# Patient Record
Sex: Female | Born: 1970 | Race: White | Hispanic: No | Smoking: Never smoker
Health system: Southern US, Community
[De-identification: ages and names within clinical notes are randomized; demographics above are authoritative.]

## PROBLEM LIST (undated history)

## (undated) DIAGNOSIS — G8929 Other chronic pain: Secondary | ICD-10-CM

## (undated) DIAGNOSIS — N903 Dysplasia of vulva, unspecified: Secondary | ICD-10-CM

## (undated) DIAGNOSIS — Z9889 Other specified postprocedural states: Secondary | ICD-10-CM

## (undated) DIAGNOSIS — M542 Cervicalgia: Secondary | ICD-10-CM

## (undated) DIAGNOSIS — R42 Dizziness and giddiness: Secondary | ICD-10-CM

## (undated) DIAGNOSIS — G43009 Migraine without aura, not intractable, without status migrainosus: Secondary | ICD-10-CM

## (undated) DIAGNOSIS — R112 Nausea with vomiting, unspecified: Secondary | ICD-10-CM

## (undated) DIAGNOSIS — Z8742 Personal history of other diseases of the female genital tract: Secondary | ICD-10-CM

## (undated) DIAGNOSIS — R002 Palpitations: Secondary | ICD-10-CM

## (undated) DIAGNOSIS — F419 Anxiety disorder, unspecified: Secondary | ICD-10-CM

## (undated) DIAGNOSIS — E0500 Thyrotoxicosis with diffuse goiter without thyrotoxic crisis or storm: Secondary | ICD-10-CM

## (undated) DIAGNOSIS — R7989 Other specified abnormal findings of blood chemistry: Secondary | ICD-10-CM

## (undated) DIAGNOSIS — I671 Cerebral aneurysm, nonruptured: Secondary | ICD-10-CM

## (undated) DIAGNOSIS — G35 Multiple sclerosis: Secondary | ICD-10-CM

## (undated) DIAGNOSIS — R945 Abnormal results of liver function studies: Secondary | ICD-10-CM

## (undated) HISTORY — DX: Migraine without aura, not intractable, without status migrainosus: G43.009

## (undated) HISTORY — DX: Other specified abnormal findings of blood chemistry: R79.89

## (undated) HISTORY — DX: Multiple sclerosis: G35

## (undated) HISTORY — DX: Thyrotoxicosis with diffuse goiter without thyrotoxic crisis or storm: E05.00

## (undated) HISTORY — DX: Abnormal results of liver function studies: R94.5

## (undated) HISTORY — DX: Cervicalgia: M54.2

---
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
## 1997-09-21 ENCOUNTER — Ambulatory Visit (HOSPITAL_COMMUNITY): Admission: RE | Admit: 1997-09-21 | Discharge: 1997-09-21 | Payer: Self-pay | Admitting: Neurology

## 1998-02-24 ENCOUNTER — Ambulatory Visit (HOSPITAL_COMMUNITY): Admission: RE | Admit: 1998-02-24 | Discharge: 1998-02-24 | Payer: Self-pay | Admitting: Obstetrics and Gynecology

## 1998-08-21 ENCOUNTER — Other Ambulatory Visit: Admission: RE | Admit: 1998-08-21 | Discharge: 1998-08-21 | Payer: Self-pay | Admitting: Obstetrics and Gynecology

## 1999-02-21 ENCOUNTER — Other Ambulatory Visit: Admission: RE | Admit: 1999-02-21 | Discharge: 1999-02-21 | Payer: Self-pay | Admitting: Obstetrics and Gynecology

## 2000-02-12 ENCOUNTER — Encounter: Admission: RE | Admit: 2000-02-12 | Discharge: 2000-02-12 | Payer: Self-pay | Admitting: *Deleted

## 2000-02-12 ENCOUNTER — Encounter: Payer: Self-pay | Admitting: *Deleted

## 2000-02-21 ENCOUNTER — Other Ambulatory Visit: Admission: RE | Admit: 2000-02-21 | Discharge: 2000-02-21 | Payer: Self-pay | Admitting: Obstetrics and Gynecology

## 2000-06-22 ENCOUNTER — Emergency Department (HOSPITAL_COMMUNITY): Admission: EM | Admit: 2000-06-22 | Discharge: 2000-06-23 | Payer: Self-pay | Admitting: Emergency Medicine

## 2000-06-23 ENCOUNTER — Encounter: Payer: Self-pay | Admitting: Emergency Medicine

## 2002-04-07 ENCOUNTER — Other Ambulatory Visit: Admission: RE | Admit: 2002-04-07 | Discharge: 2002-04-07 | Payer: Self-pay | Admitting: Obstetrics and Gynecology

## 2003-04-19 ENCOUNTER — Other Ambulatory Visit: Admission: RE | Admit: 2003-04-19 | Discharge: 2003-04-19 | Payer: Self-pay | Admitting: Obstetrics and Gynecology

## 2005-09-17 ENCOUNTER — Ambulatory Visit: Payer: Self-pay | Admitting: Psychiatry

## 2006-04-22 HISTORY — PX: BREAST ENHANCEMENT SURGERY: SHX7

## 2007-04-23 HISTORY — PX: TUBAL LIGATION: SHX77

## 2007-06-08 ENCOUNTER — Ambulatory Visit (HOSPITAL_COMMUNITY): Admission: RE | Admit: 2007-06-08 | Discharge: 2007-06-08 | Payer: Self-pay | Admitting: Obstetrics and Gynecology

## 2007-06-08 HISTORY — PX: LAPAROSCOPY WITH TUBAL LIGATION: SHX5576

## 2010-09-04 NOTE — Op Note (Signed)
Kristin Stewart, Kristin Stewart                 ACCOUNT NO.:  [id number]   MEDICAL RECORD NO.:  [id number]          PATIENT TYPE:  AMB   LOCATION:  SDC                           FACILITY:  [facility]   PHYSICIAN:  Lenoard Aden, M.D.DATE OF BIRTH:  1971-02-13   DATE OF PROCEDURE:  06/08/2007  DATE OF DISCHARGE:                               OPERATIVE REPORT   PREOPERATIVE DIAGNOSIS:  Desire for elective sterilization.   POSTOPERATIVE DIAGNOSIS:  Desire for elective sterilization.   PROCEDURE:  Diagnostic hysteroscopy, attempted Essure with secondary  uterine perforation, diagnostic laparoscopy, laparoscopic tubal ligation  with repair of small midline fundal uterine perforation.   SURGEON:  Lenoard Aden, M.D.   ASSISTANT:  None.   ANESTHESIA:  General.   ESTIMATED BLOOD LOSS:  50 mL.   COMPLICATIONS:  None.   DRAINS:  None.   COUNTS:  Correct.   Patient to recovery in good condition.   BRIEF OP NOTE:  After being apprised of risks of anesthesia, infection,  bleeding, injury to abdominal organs, need for repair, possible uterine  perforation with need for repair, failure risk of tubal ligation 5 to 10  per 1000 the patient to the operating where she was administered a  general anesthetic without complications, prepped and draped in usual  sterile fashion, catheterized until the bladder was empty.  Exam under  anesthesia reveals a small anteflexed uterus and no adnexal masses.  Speculum placed, paracervical block placed in the standard fashion.  Anterior lip of the cervix grasped using a single-tooth tenaculum and  cervix easily dilated up to a #21 Pratt dilator.  Upon entry of the  hysteroscope, visualization is poor revealing no evidence of tubal ostia  and upon further manipulation, there is diagnosis of small uterine  perforation in the midline, deficit 185 mL noted.  Hysteroscopy was  thereby aborted and removed, minimal bleeding is noted.  Rubens cannula  was placed per  vagina into the cervix and attention was turned to  laparoscopic portion of procedure whereby infraumbilical incision was  made with the scalpel.  Veress needle placed.  Opening pressure -2 was  noted.  3 liters of CO2 was insufflated without difficulty.  Trocar is  placed atraumatically.  Pictures were taken.  Liver, gallbladder bed  appear normal, appendix is not visualized.  Upon visualization of the  pelvis there is a fair amount of clear fluid in the posterior cul-de-sac  and in addition, there was a midline uterine perforation which appears  to be bleeding slowly and minimally.  At this time there are normal  tubes, normal ovaries noted.  The right tube was traced out to the  fimbriated end.  A 5 mm suprapubic port was placed for irrigation  atraumatically and under direct visualization.  At this time irrigation  having been accomplished, the right tube was traced out the fimbriated  end and ampullary isthmic portion of the tube is identified and  cauterized using bipolar cautery in three contiguous segments down to  resistance of 0.  The same procedure that was done on the right tube was  done  on the left tube and both tubes were divided.  The tubal ostia were  visualized, normal tubes, normal ovaries were noted.  At this time  Garnette Czech is entered and irrigation is performed.  The uterine perforation  area is cauterized using Kleppinger bipolar cautery with good hemostasis  noted.  Irrigation accomplished and fluid is evacuated from the pelvis.  At this time CO2 was released and revisualization reveals no bleeding  from either tubal division site nor from the uterine perforation.  At  this time the Rubens cannula was also removed per vagina and there is no  evidence of bleeding from the uterine perforation.  Thereby all trocar  sites are removed under direct visualization, CO2 having been released.  Incisions were closed using 0-0 Vicryl and Dermabond.  Instruments  removed from  vagina.  The patient is awakened and transferred to  recovery in good condition.      Lenoard Aden, M.D.  Electronically Signed     RJT/MEDQ  D:  06/08/2007  T:  06/09/2007  Job:  [id number]

## 2010-09-07 NOTE — Discharge Summary (Signed)
[name], Kristin Stewart                 ACCOUNT NO.:  [id number]   MEDICAL RECORD NO.:  [id number]          PATIENT TYPE:  AMB   LOCATION:  SDC                           FACILITY:  [facility]   PHYSICIAN:  Lenoard Aden, M.D.DATE OF BIRTH:  01-27-1971   DATE OF ADMISSION:  06/08/2007  DATE OF DISCHARGE:  06/08/2007                               DISCHARGE SUMMARY   ADMISSION DIAGNOSIS:  Desire for elective sterilization.   PROCEDURE:  The patient underwent diagnostic hysteroscopy, attempted  Essure procedure with midline uterine perforation followed by  laparoscopy with tubal ligation and cauterization of bleeding from small  fundal perforation on June 08, 2007, in an uncomplicated fashion.  Postoperatively, the patient's course was uncomplicated in the recovery  room.  Vital signs were stable.  She was afebrile.  No increased  abdominal pain or vaginal bleeding was noted.  After awakening from  anesthesia, the patient was apprised of the small midline fundal  perforation.  She prior to the procedure was consented for a  laparoscopic procedure if her Essure tubal ligation was unsuccessful,  and therefore laparoscopic tubal ligation was formed in the standard  fashion and cauterization of the fundal perforation was noted.  The  patient knowledge these complications and was discharged as noted.      Lenoard Aden, M.D.  Electronically Signed     RJT/MEDQ  D:  08/09/2007  T:  08/10/2007  Job:  [id number]

## 2011-01-11 LAB — CBC
HCT: 41.4
Hemoglobin: 14.6
MCHC: 35.2
MCV: 93.6
Platelets: 372
RBC: 4.42
RDW: 13.4
WBC: 5.8

## 2011-01-11 LAB — HCG, SERUM, QUALITATIVE: Preg, Serum: NEGATIVE

## 2012-07-14 ENCOUNTER — Telehealth: Payer: Self-pay | Admitting: *Deleted

## 2012-07-14 NOTE — Telephone Encounter (Signed)
Refill for avonex 30 mcg kit, requires insurance approval.

## 2012-07-15 NOTE — Telephone Encounter (Signed)
I was out of the office yesterday.  JCB I will initiate auth.  Will have to wait for ins response.

## 2012-11-11 ENCOUNTER — Other Ambulatory Visit: Payer: Self-pay | Admitting: Gastroenterology

## 2012-12-03 ENCOUNTER — Telehealth: Payer: Self-pay | Admitting: Neurology

## 2012-12-04 NOTE — Telephone Encounter (Signed)
Returned patients call. No answer.

## 2012-12-09 NOTE — Telephone Encounter (Signed)
Assigned to Dr. Terrace Arabia.  Given to [employer]. To schedule.

## 2013-01-02 ENCOUNTER — Encounter: Payer: Self-pay | Admitting: Neurology

## 2013-01-02 DIAGNOSIS — M542 Cervicalgia: Secondary | ICD-10-CM | POA: Insufficient documentation

## 2013-01-02 DIAGNOSIS — G35 Multiple sclerosis: Secondary | ICD-10-CM | POA: Insufficient documentation

## 2013-01-02 DIAGNOSIS — R945 Abnormal results of liver function studies: Secondary | ICD-10-CM | POA: Insufficient documentation

## 2013-01-02 DIAGNOSIS — G43009 Migraine without aura, not intractable, without status migrainosus: Secondary | ICD-10-CM | POA: Insufficient documentation

## 2013-01-04 ENCOUNTER — Ambulatory Visit (INDEPENDENT_AMBULATORY_CARE_PROVIDER_SITE_OTHER): Payer: 59 | Admitting: Neurology

## 2013-01-04 ENCOUNTER — Encounter: Payer: Self-pay | Admitting: Neurology

## 2013-01-04 VITALS — BP 134/91 | HR 72 | Ht 64.0 in | Wt 162.0 lb

## 2013-01-04 DIAGNOSIS — G35 Multiple sclerosis: Secondary | ICD-10-CM

## 2013-01-04 DIAGNOSIS — G43009 Migraine without aura, not intractable, without status migrainosus: Secondary | ICD-10-CM

## 2013-01-04 DIAGNOSIS — R945 Abnormal results of liver function studies: Secondary | ICD-10-CM

## 2013-01-04 DIAGNOSIS — R7989 Other specified abnormal findings of blood chemistry: Secondary | ICD-10-CM

## 2013-01-04 MED ORDER — SUMATRIPTAN-NAPROXEN SODIUM 85-500 MG PO TABS: 1.0000 | ORAL_TABLET | ORAL | Status: DC | PRN

## 2013-01-04 NOTE — Progress Notes (Signed)
History of Present Illness:     Kristin Stewart is a 42 year old right-handed white married female with MS patient of Dr. Sandria Manly, last visit was in Jan 2014.  She presented with right side numbness and weakness involving right hand, paresthesia at right side, from waist done.  MRI with cervical and thoracic showed abnormalities consistent with MS, she also had spinal fluid testing, she is not sure about the result.  Last MRI of the cervical spine was in 2003, showed an abnormality that was questionable lesion at C2-3, that does not enhance.  The previously noted lesions  C5-C7 in not demonstrable on the scan.  MRI of the brain 11/11/2001 showed a single lesion in the left temporal occipital periventricular white matter.  She has not had new symptoms of MS, that was the only symptom, no flare up since.  She continues to have altered sensation in the right lower half of her body. She denies , bowel or bladder incontinence, visual loss, double vision, pain, or focal weakness.    She is a Advice worker, works at urgent care.   She has mildly elevated LFTs while on Avonex, which was started since 2000, her LFTs improved after  Avonex holiday and weight loss. There is mild fatty liver on US exam.  Dr. Kinnie Scales is monitoring her LFTs, which is normal in June 2014.  Dr. Dione Booze is examined her eyes without abnormalities seen.   She has a history of migraine, around her menstruation period of time, 3-4 times a year.  Review of Systems  Out of a complete 14 system review, the patient complains of only the following symptoms, and all other reviewed systems are negative.   Comments:  Colonoscopy not done Flu Vaccine 2013 Pneumonia Vaccine not taken  Social History  She is married and works a PA. She has caffeine intake 2-3 cups  per day. She has 2-3 alcoholic beverages per weekend. She denies tobacco or recreational drug use.  Inhaled Tobacco Use: never smoker  Family History  Her paternal grandmother  had colon cancer. Her maternal grandmother has Type 2 diabetes. Her mother has anxiety and depression.   Past Medical History  Significant for migraines and MS  Surgical History  Tonsillectomy 1988. Tubal ligation 2009. Breast implants 2008  PHYSICAL EXAMINATOINS:  Generalized: In no acute distress  Neck: Supple, no carotid bruits   Cardiac: Regular rate rhythm  Pulmonary: Clear to auscultation bilaterally  Musculoskeletal: No deformity  Neurological examination  Mentation: Alert oriented to time, place, history taking, and causual conversation  Cranial nerve II-XII: Pupils were equal round reactive to light extraocular movements were full, visual field were full on confrontational test. facial sensation and strength were normal. hearing was intact to finger rubbing bilaterally. Uvula tongue midline.  head turning and shoulder shrug and were normal and symmetric.Tongue protrusion into Stepanian strength was normal.  Motor: normal tone, bulk and strength.  Sensory: Intact to fine touch, pinprick, preserved vibratory sensation, and proprioception at toes.  Coordination: Normal finger to nose, heel-to-shin bilaterally there was no truncal ataxia  Gait: Rising up from seated position without assistance, normal stance, without trunk ataxia, moderate stride, good arm swing, smooth turning, able to perform tiptoe, and heel walking without difficulty.   Romberg signs: Negative  Deep tendon reflexes: Brachioradialis 2/2, biceps 2/2, triceps 2/2, patellar 2/2, Achilles 2/2, plantar responses were flexor bilaterally.  Assessment and plan:  42 years old right-handed Caucasian female, with past medical history of possible multiple sclerosis, has been on Avonex  treatment since 1999, she is clinically stable.  1.  I will change her to Avonex Pen 2.  After discuss with patient, we decided to repeat MRI brain, spines w/wo contrast. 3.  RTC in  3 month 4. Refill treximex.

## 2013-01-20 ENCOUNTER — Ambulatory Visit
Admission: RE | Admit: 2013-01-20 | Discharge: 2013-01-20 | Disposition: A | Discharge status: Home / Self Care | Payer: 59 | Source: Ambulatory Visit | Attending: Neurology | Admitting: Neurology

## 2013-01-20 DIAGNOSIS — G35 Multiple sclerosis: Secondary | ICD-10-CM

## 2013-01-20 DIAGNOSIS — R945 Abnormal results of liver function studies: Secondary | ICD-10-CM

## 2013-01-20 DIAGNOSIS — G43009 Migraine without aura, not intractable, without status migrainosus: Secondary | ICD-10-CM

## 2013-01-20 MED ORDER — GADOBENATE DIMEGLUMINE 529 MG/ML IV SOLN
14.0000 mL | Freq: Once | INTRAVENOUS | Status: AC | PRN
  Administered 2013-01-20: 14 mL via INTRAVENOUS

## 2013-01-22 NOTE — Progress Notes (Signed)
Quick Note:  Please call patient MRI brain, cervical and thoracic spine showed evidence of MS, keep current treatment, will have further discussion on her follow up appt in 03/2013 ______

## 2013-02-05 NOTE — Progress Notes (Signed)
Quick Note:  I called pt and relayed the information re: her MRI results. (new lesions noted since last MRI). Pt verbalized understanding and will continue with avonex injections and will be seen in December for RV. Will call as needed. ______

## 2013-04-07 ENCOUNTER — Ambulatory Visit: Payer: 59 | Admitting: Neurology

## 2013-04-23 ENCOUNTER — Other Ambulatory Visit: Payer: Self-pay

## 2013-04-23 MED ORDER — INTERFERON BETA-1A 30 MCG/0.5ML IM KIT: 30.0000 ug | PACK | INTRAMUSCULAR | Status: DC

## 2013-04-27 ENCOUNTER — Other Ambulatory Visit: Payer: Self-pay

## 2013-04-27 MED ORDER — INTERFERON BETA-1A 30 MCG/0.5ML IM KIT: 30.0000 ug | PACK | INTRAMUSCULAR | Status: DC

## 2013-04-30 ENCOUNTER — Telehealth: Payer: Self-pay | Admitting: Neurology

## 2013-04-30 NOTE — Telephone Encounter (Signed)
Lollie Sails from [facility] calling about patient's Avonex script, he says they did not receive any insurance information along with it. Please call him back at his direct extension.

## 2013-04-30 NOTE — Telephone Encounter (Signed)
I called back.  Lollie Sails was not available.  Spoke with Toniann Fail.  Provided her with info we have on file.

## 2013-05-12 ENCOUNTER — Telehealth: Payer: Self-pay | Admitting: Neurology

## 2013-05-12 NOTE — Telephone Encounter (Signed)
[employer] ID# [id number]  GRP#  [id number]

## 2013-05-12 NOTE — Telephone Encounter (Signed)
Insurance requires an ID # or they will not allow me to process the request.  They will not provide the ID# to me for privacy reasons.  We do not have BCBS card on file for the patient.  She likely changed ins with the new year.  I called back, got no answer.  Left message asking for a return call with ID# so we can proceed with the request.

## 2013-05-12 NOTE — Telephone Encounter (Signed)
[employer] authorization needed for  Avonex Pen sent to [facility] [phone] - Father requests call back when completed. [phone]

## 2013-05-12 NOTE — Telephone Encounter (Signed)
I have submitted all requested info to ins, pending their response.  They indicate they will contact the patient with outcome.

## 2013-05-14 ENCOUNTER — Telehealth: Payer: Self-pay | Admitting: Neurology

## 2013-05-14 NOTE — Telephone Encounter (Signed)
Patient's father returning your call. Please call him back at the number listed.

## 2013-05-17 ENCOUNTER — Telehealth: Payer: Self-pay | Admitting: Neurology

## 2013-05-17 NOTE — Telephone Encounter (Signed)
I have called Kristin Stewart's father  They are frustrated about the MS medication choice, please give her a followup appt On Wes Jan 28th after 11am, it is OK to add on Wes schedule.

## 2013-05-17 NOTE — Telephone Encounter (Signed)
[employer]BS has denied our request for coverage on Avonex.  They state the patient must try and fail 2 formulary agents which include Tecfidera, Plegridy, Copaxone, Rebif or Betaseron.  Would you like to change to a formulary alternative?  Please advise.

## 2013-05-17 NOTE — Telephone Encounter (Signed)
Patient's father calling to state that [employer] insurance said another medication has to be tried for 30 days before  Avonex could be used. Patient's father requesting call back regarding this issue. Home# [id number] and cell: [phone]

## 2013-05-18 ENCOUNTER — Telehealth: Payer: Self-pay | Admitting: Neurology

## 2013-05-18 NOTE — Telephone Encounter (Signed)
Dr.Yan this patient is already on your schedule for 05-20-2012. I will forward this message to [facility] so research is aware.

## 2013-05-18 NOTE — Telephone Encounter (Signed)
Called and spoke to patient her appt. Is Thursday 05/20/2013 arrive at 1:45 she will here to see Dr.Yan.

## 2013-05-18 NOTE — Telephone Encounter (Signed)
Called this morning and states Dr. Terrace Arabia wanted his daughter worked on on wed or thurs and that thurs would be a better fit for them. I placed the patient on Thurs afternoon.

## 2013-05-20 ENCOUNTER — Ambulatory Visit (INDEPENDENT_AMBULATORY_CARE_PROVIDER_SITE_OTHER): Payer: BC Managed Care – PPO | Admitting: Neurology

## 2013-05-20 ENCOUNTER — Encounter: Payer: Self-pay | Admitting: Neurology

## 2013-05-20 ENCOUNTER — Encounter (INDEPENDENT_AMBULATORY_CARE_PROVIDER_SITE_OTHER): Payer: Self-pay

## 2013-05-20 VITALS — BP 126/90 | HR 93 | Ht 64.0 in | Wt 151.0 lb

## 2013-05-20 DIAGNOSIS — G43009 Migraine without aura, not intractable, without status migrainosus: Secondary | ICD-10-CM

## 2013-05-20 DIAGNOSIS — G35 Multiple sclerosis: Secondary | ICD-10-CM

## 2013-05-20 NOTE — Progress Notes (Signed)
History of Present Illness:     Kristin Stewart is a 43 year old right-handed white married female with MS patient of Dr. Sandria Manly, Last visit was in Jan 2014, I saw her for the first time in July 2014.  She was diagnosed with RRMS since 1999, was treated with Avenox since April 2000.  She presented with right side numbness and weakness involving right hand, paresthesia at right side, from waist done.    MRI with cervical and thoracic showed abnormalities consistent with MS, she also had spinal fluid testing, she is not sure about the result.  Last MRI of the cervical spine was in 2003, showed an abnormality that was questionable lesion at C2-3, that does not enhance.  The previously noted lesions  C5-C7 in not demonstrable on the scan.   MRI of the brain 11/11/2001 showed a single lesion in the left temporal occipital periventricular white matter.  She has not had new symptoms of MS, that was the only symptom, no flare up since.  She continues to have altered sensation in the right lower half of her body. She denies , bowel or bladder incontinence, visual loss, double vision, pain, or focal weakness.    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   She has mildly elevated LFTs while on Avonex, which was started since 2000, her LFTs improved after  Avonex holiday and weight loss. There is mild fatty liver on US exam.  Dr. Kinnie Scales is monitoring her LFTs, which is normal in June 2014.  Dr. Dione Booze is examined her eyes without abnormalities seen. No history of optic neuritis.   She has a history of migraine, around her menstruation period of time, 3-4 times a year.  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  We have reviewed the MRI together, done in October 2014 [place] imaging MRI of brain 4 acute demyelinating plaques (left frontal, left temporal, right temporal and right occipital). Multiple periventricular and subcortical and infratentorial chronic demyelinating plaques.  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 MRI of cervical  and thoracic spine subtle ill-defined spinal cord T2 hyperintensities at C3, C4 and C6 levels, chronic demyelinating plaques at T6-7 and T9-10.  The T6-7 lesion demonstrates contrast enhancement, may represent acute demyelinating plaque.  . Review of Systems  Out of a complete 14 system review, the patient complains of only the following symptoms, and all other reviewed systems are negative.   Comments:  Colonoscopy not done Flu Vaccine 2013 Pneumonia Vaccine not taken  Social History  She is married and works a PA. She has caffeine intake 2-3 cups  per day. She has 2-3 alcoholic beverages per weekend. She denies tobacco or recreational drug use.  Inhaled Tobacco Use: never smoker  Family History  Her paternal grandmother had colon cancer. Her maternal grandmother has Type 2 diabetes. Her mother has anxiety and depression.   Past Medical History  Significant for migraines and MS  Surgical History  Tonsillectomy 1988. Tubal ligation 2009. Breast implants 2008  PHYSICAL EXAMINATOINS:  Generalized: In no acute distress  Neck: Supple, no carotid bruits  Cardiac: Regular rate rhythm  Pulmonary: Clear to auscultation bilaterally  Musculoskeletal: No deformity  Neurological examination  Mentation: Alert oriented to time, place, history taking, and causual conversation  Cranial nerve II-XII: Pupils were equal round reactive to light extraocular movements were full, visual field were full on  confrontational test. facial sensation and strength were normal. hearing was intact to finger rubbing bilaterally. Uvula tongue midline.  head turning and shoulder shrug and were normal and symmetric.Tongue protrusion into [place] strength was normal.  Motor: normal tone, bulk and strength.  Sensory: Intact to fine touch, pinprick, preserved vibratory sensation, and proprioception at toes.  Coordination: Normal finger to nose, heel-to-shin bilaterally there was no truncal ataxia  Gait: Rising up from seated position without assistance, normal stance, without trunk ataxia, moderate stride, good arm swing, smooth turning, able to perform tiptoe, and heel walking without difficulty.   Romberg signs: Negative  Deep tendon reflexes: Brachioradialis 3/3, biceps 3/3, triceps 3/3, patellar 3/3, Achilles 2/3 she has nonsustained left ankle clonus,, plantar responses were flexor bilaterally.  Assessment and plan:  43 years old right-handed Caucasian female, with  relapsing remitting multiple sclerosis, has been on Avonex treatment since 1999, she is clinically stable. But there is contrast enhancing lesions on the MRI of the brain,  And MRI thoracic spine I have discussed with patient and her parents the treatment options, this including Silvestre Momentysarbri, Gilenya, Tecfidera, and educational materials were provided, she will return to clinic in 2-3 weeks to make a treatment decision,  Will draw JC virus antibody, varicella-zoster antibody today.

## 2013-05-21 LAB — CBC WITH DIFFERENTIAL
BASOS: 0 %
Basophils Absolute: 0 10*3/uL (ref 0.0–0.2)
EOS: 1 %
Eosinophils Absolute: 0.1 10*3/uL (ref 0.0–0.4)
HEMATOCRIT: 38.2 % (ref 34.0–46.6)
Hemoglobin: 13.5 g/dL (ref 11.1–15.9)
Immature Grans (Abs): 0 10*3/uL (ref 0.0–0.1)
Immature Granulocytes: 0 %
Lymphocytes Absolute: 3.4 10*3/uL — ABNORMAL HIGH (ref 0.7–3.1)
Lymphs: 34 %
MCH: 31.7 pg (ref 26.6–33.0)
MCHC: 35.3 g/dL (ref 31.5–35.7)
MCV: 90 fL (ref 79–97)
MONOCYTES: 6 %
MONOS ABS: 0.6 10*3/uL (ref 0.1–0.9)
NEUTROS ABS: 5.8 10*3/uL (ref 1.4–7.0)
Neutrophils Relative %: 59 %
Platelets: 359 10*3/uL (ref 150–379)
RBC: 4.26 x10E6/uL (ref 3.77–5.28)
RDW: 13.3 % (ref 12.3–15.4)
WBC: 9.9 10*3/uL (ref 3.4–10.8)

## 2013-05-21 LAB — COMPREHENSIVE METABOLIC PANEL
A/G RATIO: 2.2 (ref 1.1–2.5)
ALBUMIN: 5.1 g/dL (ref 3.5–5.5)
ALK PHOS: 87 IU/L (ref 39–117)
ALT: 23 IU/L (ref 0–32)
AST: 27 IU/L (ref 0–40)
BUN / CREAT RATIO: 16 (ref 9–23)
BUN: 12 mg/dL (ref 6–24)
CO2: 15 mmol/L — AB (ref 18–29)
CREATININE: 0.73 mg/dL (ref 0.57–1.00)
Calcium: 9.9 mg/dL (ref 8.7–10.2)
Chloride: 106 mmol/L (ref 97–108)
GFR, EST AFRICAN AMERICAN: 117 mL/min/{1.73_m2} (ref >59)
GFR, EST NON AFRICAN AMERICAN: 102 mL/min/{1.73_m2} (ref >59)
GLOBULIN, TOTAL: 2.3 g/dL (ref 1.5–4.5)
Glucose: 88 mg/dL (ref 65–99)
Potassium: 4.4 mmol/L (ref 3.5–5.2)
SODIUM: 143 mmol/L (ref 134–144)
Total Bilirubin: 0.5 mg/dL (ref 0.0–1.2)
Total Protein: 7.4 g/dL (ref 6.0–8.5)

## 2013-05-21 LAB — VARICELLA ZOSTER ANTIBODY, IGG: VARICELLA: 3339 {index} (ref >165)

## 2013-05-21 LAB — THYROID PANEL WITH TSH
Free Thyroxine Index: 1.6 (ref 1.2–4.9)
T3 Uptake Ratio: 25 % (ref 24–39)
T4 TOTAL: 6.5 ug/dL (ref 4.5–12.0)
TSH: 2.38 u[IU]/mL (ref 0.450–4.500)

## 2013-05-21 LAB — VITAMIN B12: Vitamin B-12: 484 pg/mL (ref 211–946)

## 2013-05-24 NOTE — Telephone Encounter (Signed)
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

## 2013-05-30 ENCOUNTER — Encounter: Payer: Self-pay | Admitting: Neurology

## 2013-06-07 ENCOUNTER — Ambulatory Visit: Payer: 59 | Admitting: Neurology

## 2013-06-11 ENCOUNTER — Encounter: Payer: Self-pay | Admitting: Neurology

## 2013-06-11 ENCOUNTER — Ambulatory Visit
Admission: RE | Admit: 2013-06-11 | Discharge: 2013-06-11 | Disposition: A | Discharge status: Home / Self Care | Payer: BC Managed Care – PPO | Source: Ambulatory Visit | Attending: Neurology | Admitting: Neurology

## 2013-06-11 ENCOUNTER — Ambulatory Visit (INDEPENDENT_AMBULATORY_CARE_PROVIDER_SITE_OTHER): Payer: BC Managed Care – PPO | Admitting: Neurology

## 2013-06-11 VITALS — BP 132/88 | HR 84 | Ht 64.0 in | Wt 149.0 lb

## 2013-06-11 DIAGNOSIS — I671 Cerebral aneurysm, nonruptured: Secondary | ICD-10-CM

## 2013-06-11 DIAGNOSIS — I6789 Other cerebrovascular disease: Secondary | ICD-10-CM

## 2013-06-11 DIAGNOSIS — G35 Multiple sclerosis: Secondary | ICD-10-CM

## 2013-06-11 NOTE — Progress Notes (Signed)
History of Present Illness:     Kristin Stewart is a 43 year old right-handed white married female with MS patient of Dr. Sandria Manly, Last visit was in Jan 2014, I saw her for the first time in July 2014.  She was diagnosed with RRMS since 1999, was treated with Avenox since April 2000.  She presented with right side numbness and weakness involving right hand, paresthesia at right side, from waist done.    MRI with cervical and thoracic showed abnormalities consistent with MS, she also had spinal fluid testing, she is not sure about the result.  Last MRI of the cervical spine was in 2003, showed an abnormality that was questionable lesion at C2-3, that does not enhance.  The previously noted lesions  C5-C7 in not demonstrable on the scan.   MRI of the brain 11/11/2001 showed a single lesion in the left temporal occipital periventricular white matter.  She has not had new symptoms of MS, that was the only symptom on presentation, no flare up since.  She continues to have altered sensation in the right lower half of her body. She denies , bowel or bladder incontinence, visual loss, double vision, pain, or focal weakness.   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   She has mildly elevated LFTs while on Avonex, which was started since 2000, her LFTs improved after  Avonex holiday and weight loss. There is mild fatty liver on US exam.  Dr. Kinnie Scales is monitoring her LFTs, which is normal in June 2014.  Dr. Dione Booze is examined her eyes without abnormalities seen. No history of optic neuritis.   She has a history of migraine, around her menstruation period of time, 3-4 times a year.  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  We have reviewed the MRI together, done in October 2014 [facility]  MRI of brain 4 acute demyelinating plaques (left frontal, left temporal, right temporal and right occipital). Multiple periventricular and subcortical and infratentorial chronic demyelinating plaques.  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 MRI of cervical  and thoracic spine subtle ill-defined spinal cord T2 hyperintensities at C3, C4 and C6 levels, chronic demyelinating plaques at T6-7 and T9-10.   The T6-7 lesion demonstrates contrast enhancement, may represent acute demyelinating plaque.  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 Review of Systems  Out of a complete 14 system review, the patient complains of only the following symptoms, and all other reviewed systems are negative.   Comments:  Colonoscopy not done Flu Vaccine 2013 Pneumonia Vaccine not taken  Social History  She is married and works a PA. She has caffeine intake 2-3 cups  per day. She has 2-3 alcoholic beverages per weekend. She denies tobacco or recreational drug use.  Inhaled Tobacco Use: never  smoker  Family History  Her paternal grandmother had colon cancer. Her maternal grandmother has Type 2 diabetes. Her mother has anxiety and depression.   Past Medical History  Significant for migraines and MS  Surgical History  Tonsillectomy 1988. Tubal ligation 2009. Breast implants 2008  PHYSICAL EXAMINATOINS:  Generalized: In no acute distress  Neck: Supple, no carotid  bruits   Cardiac: Regular rate rhythm  Pulmonary: Clear to auscultation bilaterally  Musculoskeletal: No deformity  Neurological examination  Mentation: Alert oriented to time, place, history taking, and causual conversation  Cranial nerve II-XII: Pupils were equal round reactive to light extraocular movements were full, visual field were full on confrontational test. facial sensation and strength were normal. hearing was intact to finger rubbing bilaterally. Uvula tongue midline.  head turning and shoulder shrug and were normal and symmetric.Tongue protrusion into Keir strength was normal.  Motor: normal tone, bulk and strength.  Sensory: Intact to fine touch, pinprick, preserved vibratory sensation, and proprioception at toes.  Coordination: Normal finger to nose, heel-to-shin bilaterally there was no truncal ataxia  Gait: Rising up from seated position without assistance, normal stance, without trunk ataxia, moderate stride, good arm swing, smooth turning, able to perform tiptoe, and heel walking without difficulty.   Romberg signs: Negative  Deep tendon reflexes: Brachioradialis 3/3, biceps 3/3, triceps 3/3, patellar 3/3, Achilles 2/3 she has nonsustained left ankle clonus,, plantar responses were flexor bilaterally.  Assessment and plan:  43 years old right-handed Caucasian female, with  relapsing remitting multiple sclerosis, has been on Avonex treatment since 2000, she is clinically stable. But there is contrast enhancing lesions on the MRI of the brain,  and MRI thoracic spine  1. we decided to switch her treatment to Tecfidera 2. neurosurgeon refer to Dr. Lisbeth RenshawNeelesh Nundkumar for left internal carotid artery aneurysm 3 return to clinic in 3 months.

## 2013-06-16 ENCOUNTER — Telehealth: Payer: Self-pay | Admitting: Neurology

## 2013-06-16 NOTE — Telephone Encounter (Signed)
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 No significant stenosis of the medium to large size and vessels.   She is already seen by [facility].

## 2013-06-16 NOTE — Telephone Encounter (Signed)
Patient is requesting MRA results. Please advise.

## 2013-06-16 NOTE — Telephone Encounter (Signed)
Patient calling to request MRA results. Please call patient.

## 2013-06-18 ENCOUNTER — Telehealth: Payer: Self-pay | Admitting: *Deleted

## 2013-06-18 NOTE — Telephone Encounter (Signed)
Patient inquiring about prior auth. Please advise.

## 2013-06-18 NOTE — Telephone Encounter (Signed)
I contacted [employer].  They have approved our request for coverage on Tecfidera Effective from 06/18/2013 through 04/21/2038 Ref # [id number].  I called the patient back.  Got no answer.  Left message.  I called alternate number given in message from front desk of [phone].  Spoke with Kristin Stewart.  Advised the PA has been approved, and as well gave him the number to [employer] [phone] to follow up with them if needed.  He was very appreciative that we took care of this so quickly.  They will call us back if needed.

## 2013-06-21 ENCOUNTER — Telehealth: Payer: Self-pay

## 2013-06-21 MED ORDER — DIMETHYL FUMARATE 120 & 240 MG PO MISC: 1.0000 | Freq: Two times a day (BID) | ORAL | Status: AC

## 2013-06-21 NOTE — Telephone Encounter (Signed)
Mr Kristin Stewart came into the office stating we need to call a Rx for Tecfidera into [facility].  I called [facility] and spoke with Melanie.  They said the patient is not serviced by them.  Patient has been getting refills through [facility], so I called them.  Spoke with [facility].  He said they do have the patient on file.  He then [facility].  I gave her a verbal order for starter pack and maintenance dose.  They will proccess request and contact the patient for shipment. Lynden AngCathy spoke with father, and he is aware Rx was being called in.

## 2013-06-22 ENCOUNTER — Telehealth: Payer: Self-pay | Admitting: Neurology

## 2013-06-22 NOTE — Telephone Encounter (Signed)
I called back.  Spoke with Boneta Lucks.  She said the patient is not enrolled with them.  The patient's insurance card says [facility] is the pharmacy benefit administrator, and I spoke with them yesterday.  She said [employer] likely sent the form to them in error, and she verified they are not affiliated with [employer] in any way and cannot process Rx's for them.

## 2013-06-22 NOTE — Telephone Encounter (Signed)
Boneta LucksJenny from [facility] called.  She stated that she received the referral form for Tecidera, the patient demographic sheet and the doctor's name, however there was no insurance information or a written script with the referral form.  Boneta LucksJenny asked that you call her back directly at [phone] so that you are dealing with just one person and get this resolved quicker.  Please reference message from 06-21-13 as well.  Thank you

## 2013-07-01 ENCOUNTER — Other Ambulatory Visit: Payer: Self-pay | Admitting: Neurosurgery

## 2013-07-01 ENCOUNTER — Other Ambulatory Visit (HOSPITAL_COMMUNITY): Payer: Self-pay | Admitting: Neurosurgery

## 2013-07-01 DIAGNOSIS — I729 Aneurysm of unspecified site: Secondary | ICD-10-CM

## 2013-07-05 ENCOUNTER — Encounter (HOSPITAL_COMMUNITY): Payer: Self-pay | Admitting: Pharmacy Technician

## 2013-07-08 ENCOUNTER — Other Ambulatory Visit: Payer: Self-pay | Admitting: Radiology

## 2013-07-13 ENCOUNTER — Other Ambulatory Visit (HOSPITAL_COMMUNITY): Payer: Self-pay | Admitting: Neurosurgery

## 2013-07-13 ENCOUNTER — Ambulatory Visit (HOSPITAL_COMMUNITY)
Admission: RE | Admit: 2013-07-13 | Discharge: 2013-07-13 | Disposition: A | Discharge status: Home / Self Care | Payer: BC Managed Care – PPO | Source: Ambulatory Visit | Attending: Neurosurgery | Admitting: Neurosurgery

## 2013-07-13 DIAGNOSIS — G35 Multiple sclerosis: Secondary | ICD-10-CM | POA: Insufficient documentation

## 2013-07-13 DIAGNOSIS — I729 Aneurysm of unspecified site: Secondary | ICD-10-CM

## 2013-07-13 DIAGNOSIS — I671 Cerebral aneurysm, nonruptured: Secondary | ICD-10-CM | POA: Insufficient documentation

## 2013-07-13 LAB — BASIC METABOLIC PANEL
BUN: 11 mg/dL (ref 6–23)
CHLORIDE: 111 meq/L (ref 96–112)
CO2: 20 mEq/L (ref 19–32)
CREATININE: 0.59 mg/dL (ref 0.50–1.10)
Calcium: 9.1 mg/dL (ref 8.4–10.5)
GFR calc non Af Amer: >90 mL/min (ref >90)
Glucose, Bld: 86 mg/dL (ref 70–99)
Potassium: 3.8 mEq/L (ref 3.7–5.3)
SODIUM: 144 meq/L (ref 137–147)

## 2013-07-13 LAB — CBC WITH DIFFERENTIAL/PLATELET
BASOS ABS: 0 10*3/uL (ref 0.0–0.1)
Basophils Relative: 0 % (ref 0–1)
Eosinophils Absolute: 0.3 10*3/uL (ref 0.0–0.7)
Eosinophils Relative: 5 % (ref 0–5)
HEMATOCRIT: 36.5 % (ref 36.0–46.0)
Hemoglobin: 13 g/dL (ref 12.0–15.0)
Lymphocytes Relative: 31 % (ref 12–46)
Lymphs Abs: 2.3 10*3/uL (ref 0.7–4.0)
MCH: 32 pg (ref 26.0–34.0)
MCHC: 35.6 g/dL (ref 30.0–36.0)
MCV: 89.9 fL (ref 78.0–100.0)
Monocytes Absolute: 0.6 10*3/uL (ref 0.1–1.0)
Monocytes Relative: 8 % (ref 3–12)
NEUTROS ABS: 4.2 10*3/uL (ref 1.7–7.7)
Neutrophils Relative %: 57 % (ref 43–77)
Platelets: 314 10*3/uL (ref 150–400)
RBC: 4.06 MIL/uL (ref 3.87–5.11)
RDW: 13.8 % (ref 11.5–15.5)
WBC: 7.4 10*3/uL (ref 4.0–10.5)

## 2013-07-13 LAB — PROTIME-INR
INR: 0.94 (ref 0.00–1.49)
PROTHROMBIN TIME: 12.4 s (ref 11.6–15.2)

## 2013-07-13 LAB — APTT: APTT: 27 s (ref 24–37)

## 2013-07-13 MED ORDER — MIDAZOLAM HCL 2 MG/2ML IJ SOLN
INTRAMUSCULAR | Status: AC | PRN
  Administered 2013-07-13: 0.5 mg via INTRAVENOUS

## 2013-07-13 MED ORDER — HEPARIN SODIUM (PORCINE) 1000 UNIT/ML IJ SOLN
INTRAMUSCULAR | Status: AC | PRN
  Administered 2013-07-13: 2000 [IU] via INTRAVENOUS

## 2013-07-13 MED ORDER — FENTANYL CITRATE 0.05 MG/ML IJ SOLN
INTRAMUSCULAR | Status: AC | PRN
  Administered 2013-07-13: 25 ug via INTRAVENOUS

## 2013-07-13 MED ORDER — MIDAZOLAM HCL 2 MG/2ML IJ SOLN
INTRAMUSCULAR | Status: AC
  Filled 2013-07-13: qty 2

## 2013-07-13 MED ORDER — FENTANYL CITRATE 0.05 MG/ML IJ SOLN
INTRAMUSCULAR | Status: AC
  Filled 2013-07-13: qty 2

## 2013-07-13 MED ORDER — IOHEXOL 300 MG/ML  SOLN
150.0000 mL | Freq: Once | INTRAMUSCULAR | Status: AC | PRN
  Administered 2013-07-13: 75 mL via INTRA_ARTERIAL

## 2013-07-13 NOTE — Sedation Documentation (Signed)
IR tech holding pressure; exoseal did not take.

## 2013-07-13 NOTE — Brief Op Note (Signed)
PREOP DX: LICA aneurysm  POSTOP DX: Same  PROCEDURE: Diagnostic cerebral angiogram  SURGEON: Dr. Lisbeth Renshaw, MD  ANESTHESIA: IV Sedation with Local  EBL: Minimal  SPECIMENS: None  COMPLICATIONS: None  CONDITION: Stable to recovery  FINDINGS: 1. 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 No other aneurysms/AVM/fistulas seen 3. Good hemostasis with 5Fr ExoSeal closure RCFA

## 2013-07-13 NOTE — H&P (Signed)
History of Present Illness:   Kristin Stewart is a 43 year old woman seen for the first time in the office. She has a history of multiple sclerosis, and has recently switched doctors. In an attempt to consolidate medications, she underwent repeat MRI scan of her brain which demonstrate of the possibility of an intracranial aneurysm. Further workup included MR and of the brain which did demonstrate a likely left-sided periophthalmic internal carotid artery aneurysm, and she presents for evaluation of this. She has no clinical symptoms, denying headache, visual changes, new numbness tingling or weakness in the extremities. In fact, she is also a symptomatically from her multiple sclerosis, which has been followed radiographically primarily.    PAST MEDICAL/SURGICAL HISTORY (Detailed)  Disease/disorder Onset Date Management Date Comments    tubal ligation 2009     Tonsillectomy 1988     breast augmentation     PAST MEDICAL HISTORY, SURGICAL HISTORY, FAMILY HISTORY, SOCIAL HISTORY AND REVIEW OF SYSTEMS  I have reviewed the patient's past medical, surgical, family and social history as well as the comprehensive review of systems as included on the [facility] history form dated 06/23/2013, which I have signed.  Family History (Detailed)  Relationship Family Member Name Deceased Age at Death Condition Onset Age Cause of Death  Father    Hypertension  N  Mother    Hypertension  N  SOCIAL HISTORY (Detailed)  Tobacco use reviewed.  Preferred language is Unknown.  Smoking status: Never smoker.  SMOKING STATUS  Use Status Type Smoking Status Usage Per Day Years Used Total Pack Years  no/never  Never smoker      MEDICATIONS(added, continued or stopped this visit):    Started Medication Directions Instruction Stopped   ADVIL take 2 tablet by oral route every 4 - 6 hours as needed with food     Avonex 30 mcg intramuscular kit inject (30MCG) by intramuscular route every week      clonazepam 0.5 mg tablet take 1 tablet by oral route every day     Effexor XR 75 mg capsule,extended release take 1 capsule by oral route every day with food     multivitamin tablet      Topamax 100 mg tablet take 2 tablet by oral route 2 times every day     ALLERGIES:  Ingredient Reaction Medication Name Comment  NO KNOWN ALLERGIES     No known allergies.  Vitals  Date Temp F BP Pulse Ht In Wt Lb BMI BSA Pain Score  06/23/2013  133/84 87 65 145 24.13  0/10   PHYSICAL EXAM  General  Level of Distress: no acute distress  Overall Appearance: Normal  Neurological  Orientation: normal  Attention Span and Concentration: normal  Language: Fluent, appropriate  Musculoskeletal  Gait and Station: normal  Best boy  .Any abnormal findings will be noted below..  Right Left  Deltoid: 5/5 5/5  Biceps: 5/5 5/5  Triceps: 5/5 5/5  Infraspinatus: 5/5 5/5  Wrist Extensor: 5/5 5/5  Grip: 5/5 5/5  Hip Flexor: 5/5 5/5  Knee Extensor: 5/5 5/5  Tib Anterior: 5/5 5/5  EHL: 5/5 5/5  Medial Gastroc: 5/5 5/5  Deep Tendon Reflexes  Right Left  Biceps: normal increase  Triceps: normal increase  Brachiloradialis: normal increase  Patellar: normal increase  Achilles: normal increase  Sensory  Sensation was tested at C2 to T1 and L1 to S1.  Cranial Nerves  II. Optic Nerve/Visual Fields: bilateral normal  III. Oculomotor: bilateral EOMI  IV.  Trochlear: bilateral EOMI  V. Trigeminal: bilateral Facial sensation intact  VI. Abducens: bilateral EOMI  VII. Facial: bilateral normal  VIII. Acoustic/Vestibular: bilateral Intact to finger rub  IX. Glossopharyngeal: bilateral Palate elevates symetrically  X. Vagus: bilateral Uvula midline  XI. Spinal Accessory: bilateral Normal shoulder shrug  XII. Hypoglossal: bilateral Tongue midline   DIAGNOSTIC RESULTS  MRA of the brain demonstrates an approximately 8 mm left-sided ophthalmic internal carotid artery aneurysm, projecting superiorly.    IMPRESSION  43 year old woman with incidental discovery of paraophthalmic ICA aneurysm.   PLAN - Diagnostic cerebral angiogram for definitive diagnosis and characterization of the aneurysm.   A comprehensive discussion was had with the patient and her family including husband and mother totaling approximately 30 minutes in the office. The natural history of cerebral aneurysms was reviewed in detail, with a 1-2% per year risk of rupture. The general treatment options were also discussed, with the need for definitive diagnosis by catheter angiogram. The risks of the angiogram procedure were reviewed, including a 0.1% risk of stroke, and overal risk of approximately 1% including but not limited to groin hematoma, headache, contrast reaction, and nephropathy.   The patient understood our discussion and is willing to proceed as above.

## 2013-07-13 NOTE — Discharge Instructions (Signed)

## 2013-07-19 ENCOUNTER — Other Ambulatory Visit: Payer: Self-pay | Admitting: Neurosurgery

## 2013-07-19 ENCOUNTER — Other Ambulatory Visit (HOSPITAL_COMMUNITY): Payer: Self-pay | Admitting: Neurosurgery

## 2013-07-19 DIAGNOSIS — I671 Cerebral aneurysm, nonruptured: Secondary | ICD-10-CM

## 2013-07-21 DIAGNOSIS — I671 Cerebral aneurysm, nonruptured: Secondary | ICD-10-CM

## 2013-07-21 HISTORY — DX: Cerebral aneurysm, nonruptured: I67.1

## 2013-07-29 ENCOUNTER — Encounter (HOSPITAL_COMMUNITY): Payer: Self-pay | Admitting: Pharmacy Technician

## 2013-08-10 ENCOUNTER — Encounter (HOSPITAL_COMMUNITY): Payer: Self-pay | Admitting: *Deleted

## 2013-08-10 NOTE — Progress Notes (Signed)
Spoke with Darl Pikes (surgical scheduler for Dr. Conchita Paris ) to verify pt instructions for Aspirin and Plavix on DOS in addition to NSAID's  and Fish oil instructions. According to Darl Pikes," pt is instructed to take Aspirin and Plavix starting 7-10 days prior to procedure, including the day of the procedure and pt does not need to stop NSAID's and Fish oil."

## 2013-08-11 MED ORDER — CEFAZOLIN SODIUM-DEXTROSE 2-3 GM-% IV SOLR
2.0000 g | INTRAVENOUS | Status: AC
  Administered 2013-08-12: 2 g via INTRAVENOUS
  Filled 2013-08-11: qty 50

## 2013-08-12 ENCOUNTER — Encounter (HOSPITAL_COMMUNITY): Payer: BC Managed Care – PPO | Admitting: Anesthesiology

## 2013-08-12 ENCOUNTER — Inpatient Hospital Stay (HOSPITAL_COMMUNITY)
Admission: RE | Admit: 2013-08-12 | Discharge: 2013-08-16 | DRG: 027 | Disposition: A | Discharge status: Home / Self Care | Payer: BC Managed Care – PPO | Source: Ambulatory Visit | Attending: Neurosurgery | Admitting: Neurosurgery

## 2013-08-12 ENCOUNTER — Encounter (HOSPITAL_COMMUNITY): Payer: Self-pay | Admitting: Surgery

## 2013-08-12 ENCOUNTER — Encounter (HOSPITAL_COMMUNITY)
Admission: RE | Disposition: A | Discharge status: Home / Self Care | Payer: Self-pay | Source: Ambulatory Visit | Attending: Neurosurgery

## 2013-08-12 ENCOUNTER — Ambulatory Visit (HOSPITAL_COMMUNITY)
Admission: RE | Admit: 2013-08-12 | Discharge: 2013-08-12 | Disposition: A | Discharge status: Home / Self Care | Payer: BC Managed Care – PPO | Source: Ambulatory Visit | Attending: Neurosurgery | Admitting: Neurosurgery

## 2013-08-12 ENCOUNTER — Ambulatory Visit (HOSPITAL_COMMUNITY): Payer: BC Managed Care – PPO | Admitting: Anesthesiology

## 2013-08-12 DIAGNOSIS — F411 Generalized anxiety disorder: Secondary | ICD-10-CM | POA: Diagnosis present

## 2013-08-12 DIAGNOSIS — I739 Peripheral vascular disease, unspecified: Secondary | ICD-10-CM | POA: Diagnosis present

## 2013-08-12 DIAGNOSIS — I671 Cerebral aneurysm, nonruptured: Secondary | ICD-10-CM

## 2013-08-12 DIAGNOSIS — H532 Diplopia: Secondary | ICD-10-CM | POA: Diagnosis present

## 2013-08-12 DIAGNOSIS — G35 Multiple sclerosis: Secondary | ICD-10-CM | POA: Diagnosis present

## 2013-08-12 HISTORY — DX: Cerebral aneurysm, nonruptured: I67.1

## 2013-08-12 HISTORY — PX: RADIOLOGY WITH ANESTHESIA: SHX6223

## 2013-08-12 HISTORY — DX: Other specified postprocedural states: R11.2

## 2013-08-12 HISTORY — DX: Anxiety disorder, unspecified: F41.9

## 2013-08-12 HISTORY — DX: Dizziness and giddiness: R42

## 2013-08-12 HISTORY — DX: Other specified postprocedural states: Z98.890

## 2013-08-12 LAB — CBC WITH DIFFERENTIAL/PLATELET
BASOS PCT: 0 % (ref 0–1)
Basophils Absolute: 0 10*3/uL (ref 0.0–0.1)
EOS ABS: 0.6 10*3/uL (ref 0.0–0.7)
EOS PCT: 7 % — AB (ref 0–5)
HCT: 37.9 % (ref 36.0–46.0)
Hemoglobin: 13.1 g/dL (ref 12.0–15.0)
LYMPHS ABS: 2.9 10*3/uL (ref 0.7–4.0)
Lymphocytes Relative: 31 % (ref 12–46)
MCH: 31.7 pg (ref 26.0–34.0)
MCHC: 34.6 g/dL (ref 30.0–36.0)
MCV: 91.8 fL (ref 78.0–100.0)
Monocytes Absolute: 0.7 10*3/uL (ref 0.1–1.0)
Monocytes Relative: 7 % (ref 3–12)
Neutro Abs: 5.2 10*3/uL (ref 1.7–7.7)
Neutrophils Relative %: 55 % (ref 43–77)
Platelets: 307 10*3/uL (ref 150–400)
RBC: 4.13 MIL/uL (ref 3.87–5.11)
RDW: 13.6 % (ref 11.5–15.5)
WBC: 9.5 10*3/uL (ref 4.0–10.5)

## 2013-08-12 LAB — BASIC METABOLIC PANEL
BUN: 19 mg/dL (ref 6–23)
CO2: 21 meq/L (ref 19–32)
CREATININE: 0.57 mg/dL (ref 0.50–1.10)
Calcium: 9.2 mg/dL (ref 8.4–10.5)
Chloride: 108 mEq/L (ref 96–112)
GFR calc Af Amer: >90 mL/min (ref >90)
GFR calc non Af Amer: >90 mL/min (ref >90)
Glucose, Bld: 89 mg/dL (ref 70–99)
Potassium: 3.5 mEq/L — ABNORMAL LOW (ref 3.7–5.3)
Sodium: 143 mEq/L (ref 137–147)

## 2013-08-12 LAB — MRSA PCR SCREENING: MRSA by PCR: NEGATIVE

## 2013-08-12 LAB — APTT: aPTT: 28 seconds (ref 24–37)

## 2013-08-12 LAB — HCG, SERUM, QUALITATIVE: Preg, Serum: NEGATIVE

## 2013-08-12 LAB — PROTIME-INR
INR: 0.99 (ref 0.00–1.49)
PROTHROMBIN TIME: 12.9 s (ref 11.6–15.2)

## 2013-08-12 SURGERY — RADIOLOGY WITH ANESTHESIA
Anesthesia: General

## 2013-08-12 MED ORDER — TOPIRAMATE 100 MG PO TABS
100.0000 mg | ORAL_TABLET | Freq: Every day | ORAL | Status: DC
  Administered 2013-08-12 – 2013-08-15 (×4): 100 mg via ORAL
  Filled 2013-08-12 (×5): qty 1

## 2013-08-12 MED ORDER — DIPHENHYDRAMINE HCL 25 MG PO TABS
25.0000 mg | ORAL_TABLET | Freq: Two times a day (BID) | ORAL | Status: DC | PRN
  Filled 2013-08-12: qty 1

## 2013-08-12 MED ORDER — CLOPIDOGREL BISULFATE 75 MG PO TABS
75.0000 mg | ORAL_TABLET | Freq: Every day | ORAL | Status: DC
  Administered 2013-08-13 – 2013-08-16 (×4): 75 mg via ORAL
  Filled 2013-08-12 (×5): qty 1

## 2013-08-12 MED ORDER — ESMOLOL HCL 10 MG/ML IV SOLN
INTRAVENOUS | Status: DC | PRN
  Administered 2013-08-12: 20 mg via INTRAVENOUS

## 2013-08-12 MED ORDER — GLYCOPYRROLATE 0.2 MG/ML IJ SOLN
INTRAMUSCULAR | Status: DC | PRN
  Administered 2013-08-12: .8 mg via INTRAVENOUS

## 2013-08-12 MED ORDER — NEOSTIGMINE METHYLSULFATE 1 MG/ML IJ SOLN
INTRAMUSCULAR | Status: DC | PRN
  Administered 2013-08-12: 5 mg via INTRAVENOUS

## 2013-08-12 MED ORDER — FENTANYL CITRATE 0.05 MG/ML IJ SOLN
INTRAMUSCULAR | Status: DC | PRN
  Administered 2013-08-12 (×2): 25 ug via INTRAVENOUS
  Administered 2013-08-12: 100 ug via INTRAVENOUS
  Administered 2013-08-12 (×2): 25 ug via INTRAVENOUS

## 2013-08-12 MED ORDER — BISMUTH SUBSALICYLATE 262 MG PO CHEW
524.0000 mg | CHEWABLE_TABLET | Freq: Two times a day (BID) | ORAL | Status: DC | PRN
  Filled 2013-08-12: qty 2

## 2013-08-12 MED ORDER — CLONAZEPAM 0.5 MG PO TABS
0.5000 mg | ORAL_TABLET | Freq: Every evening | ORAL | Status: DC | PRN
  Administered 2013-08-12: 0.5 mg via ORAL
  Filled 2013-08-12: qty 1

## 2013-08-12 MED ORDER — IBUPROFEN 600 MG PO TABS
600.0000 mg | ORAL_TABLET | Freq: Four times a day (QID) | ORAL | Status: DC | PRN
  Administered 2013-08-12 – 2013-08-13 (×2): 600 mg via ORAL
  Filled 2013-08-12 (×3): qty 1

## 2013-08-12 MED ORDER — FENTANYL CITRATE 0.05 MG/ML IJ SOLN: 25.0000 ug | INTRAMUSCULAR | Status: DC | PRN

## 2013-08-12 MED ORDER — IOHEXOL 300 MG/ML  SOLN
150.0000 mL | Freq: Once | INTRAMUSCULAR | Status: AC | PRN
  Administered 2013-08-12: 50 mL via INTRA_ARTERIAL

## 2013-08-12 MED ORDER — ONDANSETRON HCL 4 MG/2ML IJ SOLN
INTRAMUSCULAR | Status: DC | PRN
  Administered 2013-08-12: 4 mg via INTRAVENOUS

## 2013-08-12 MED ORDER — VENLAFAXINE HCL ER 75 MG PO CP24
75.0000 mg | ORAL_CAPSULE | Freq: Every day | ORAL | Status: DC
  Administered 2013-08-13 – 2013-08-16 (×4): 75 mg via ORAL
  Filled 2013-08-12 (×5): qty 1

## 2013-08-12 MED ORDER — MIDAZOLAM HCL 5 MG/5ML IJ SOLN
INTRAMUSCULAR | Status: DC | PRN
  Administered 2013-08-12: 2 mg via INTRAVENOUS

## 2013-08-12 MED ORDER — LACTATED RINGERS IV SOLN
INTRAVENOUS | Status: DC | PRN
  Administered 2013-08-12 (×2): via INTRAVENOUS

## 2013-08-12 MED ORDER — LABETALOL HCL 5 MG/ML IV SOLN: 10.0000 mg | INTRAVENOUS | Status: DC | PRN

## 2013-08-12 MED ORDER — PANTOPRAZOLE SODIUM 40 MG PO TBEC
40.0000 mg | DELAYED_RELEASE_TABLET | Freq: Every day | ORAL | Status: DC
  Administered 2013-08-12 – 2013-08-16 (×5): 40 mg via ORAL
  Filled 2013-08-12 (×4): qty 1

## 2013-08-12 MED ORDER — DEXAMETHASONE SODIUM PHOSPHATE 10 MG/ML IJ SOLN
INTRAMUSCULAR | Status: DC | PRN
  Administered 2013-08-12: 4 mg via INTRAVENOUS

## 2013-08-12 MED ORDER — LACTATED RINGERS IV SOLN
INTRAVENOUS | Status: DC
  Administered 2013-08-12: 07:00:00 via INTRAVENOUS

## 2013-08-12 MED ORDER — ONDANSETRON HCL 4 MG/2ML IJ SOLN
4.0000 mg | INTRAMUSCULAR | Status: DC | PRN
  Administered 2013-08-13 – 2013-08-16 (×7): 4 mg via INTRAVENOUS
  Filled 2013-08-12 (×7): qty 2

## 2013-08-12 MED ORDER — LABETALOL HCL 5 MG/ML IV SOLN
INTRAVENOUS | Status: DC | PRN
  Administered 2013-08-12: 5 mg via INTRAVENOUS

## 2013-08-12 MED ORDER — HEPARIN SODIUM (PORCINE) 5000 UNIT/ML IJ SOLN
5000.0000 [IU] | Freq: Three times a day (TID) | INTRAMUSCULAR | Status: DC
  Administered 2013-08-13 – 2013-08-16 (×10): 5000 [IU] via SUBCUTANEOUS
  Filled 2013-08-12 (×13): qty 1

## 2013-08-12 MED ORDER — PROMETHAZINE HCL 25 MG PO TABS
12.5000 mg | ORAL_TABLET | ORAL | Status: DC | PRN
Start: 2013-08-12 — End: 2013-08-16

## 2013-08-12 MED ORDER — SODIUM CHLORIDE 0.9 % IV SOLN
INTRAVENOUS | Status: DC
  Administered 2013-08-13 – 2013-08-16 (×7): via INTRAVENOUS

## 2013-08-12 MED ORDER — PHENYLEPHRINE HCL 10 MG/ML IJ SOLN
INTRAMUSCULAR | Status: DC | PRN
  Administered 2013-08-12 (×4): 40 ug via INTRAVENOUS

## 2013-08-12 MED ORDER — HEPARIN SODIUM (PORCINE) 1000 UNIT/ML IJ SOLN
INTRAMUSCULAR | Status: DC | PRN
  Administered 2013-08-12: 5000 [IU] via INTRAVENOUS

## 2013-08-12 MED ORDER — PROPOFOL 10 MG/ML IV BOLUS
INTRAVENOUS | Status: DC | PRN
  Administered 2013-08-12: 100 mg via INTRAVENOUS
  Administered 2013-08-12: 200 mg via INTRAVENOUS

## 2013-08-12 MED ORDER — ONDANSETRON HCL 4 MG PO TABS: 4.0000 mg | ORAL_TABLET | ORAL | Status: DC | PRN

## 2013-08-12 MED ORDER — PANTOPRAZOLE SODIUM 40 MG IV SOLR
40.0000 mg | Freq: Every day | INTRAVENOUS | Status: DC
  Filled 2013-08-12: qty 40

## 2013-08-12 MED ORDER — LIDOCAINE HCL (CARDIAC) 20 MG/ML IV SOLN
INTRAVENOUS | Status: DC | PRN
  Administered 2013-08-12: 80 mg via INTRAVENOUS

## 2013-08-12 MED ORDER — LIDOCAINE HCL 4 % MT SOLN
OROMUCOSAL | Status: DC | PRN
  Administered 2013-08-12: 4 mL via TOPICAL

## 2013-08-12 MED ORDER — ONDANSETRON HCL 4 MG/2ML IJ SOLN: 4.0000 mg | Freq: Four times a day (QID) | INTRAMUSCULAR | Status: DC | PRN

## 2013-08-12 MED ORDER — ASPIRIN EC 325 MG PO TBEC
325.0000 mg | DELAYED_RELEASE_TABLET | Freq: Every day | ORAL | Status: DC
  Administered 2013-08-13 – 2013-08-16 (×4): 325 mg via ORAL
  Filled 2013-08-12 (×4): qty 1

## 2013-08-12 MED ORDER — EPHEDRINE SULFATE 50 MG/ML IJ SOLN
INTRAMUSCULAR | Status: DC | PRN
  Administered 2013-08-12: 5 mg via INTRAVENOUS

## 2013-08-12 MED ORDER — ROCURONIUM BROMIDE 100 MG/10ML IV SOLN
INTRAVENOUS | Status: DC | PRN
  Administered 2013-08-12: 10 mg via INTRAVENOUS
  Administered 2013-08-12: 50 mg via INTRAVENOUS
  Administered 2013-08-12: 10 mg via INTRAVENOUS

## 2013-08-12 NOTE — Anesthesia Preprocedure Evaluation (Signed)
Anesthesia Evaluation  Patient identified by MRN, date of birth, ID band Patient awake    Reviewed: Allergy & Precautions, H&P , NPO status , Patient's Chart, lab work & pertinent test results  History of Anesthesia Complications (+) PONV  Airway Mallampati: II  Neck ROM: full    Dental   Pulmonary          Cardiovascular + Peripheral Vascular Disease     Neuro/Psych  Headaches, Anxiety Cerebral aneurysm.  H/o multiple sclerosis.    GI/Hepatic   Endo/Other    Renal/GU      Musculoskeletal   Abdominal   Peds  Hematology   Anesthesia Other Findings   Reproductive/Obstetrics                           Anesthesia Physical Anesthesia Plan  ASA: II  Anesthesia Plan: General   Post-op Pain Management:    Induction: Intravenous  Airway Management Planned: Oral ETT  Additional Equipment: Arterial line  Intra-op Plan:   Post-operative Plan: Extubation in OR  Informed Consent: I have reviewed the patients History and Physical, chart, labs and discussed the procedure including the risks, benefits and alternatives for the proposed anesthesia with the patient or authorized representative who has indicated his/her understanding and acceptance.     Plan Discussed with: CRNA, Anesthesiologist and Surgeon  Anesthesia Plan Comments:         Anesthesia Quick Evaluation

## 2013-08-12 NOTE — Progress Notes (Signed)
Right groin site Level 0, gauze/transparent dsg CDI. Will monitor.

## 2013-08-12 NOTE — Brief Op Note (Signed)
PREOP DX: LICA aneurysm  POSTOP DX: Same  PROCEDURE: Diagnostic cerebral angiogram  SURGEON: Dr. Lisbeth Renshaw, MD  ANESTHESIA: GETA  EBL: Minimal  SPECIMENS: None  COMPLICATIONS: None  CONDITION: Stable to recovery  FINDINGS: 1. 4.25 x 93mm PED placed with contrast stasis seen within the aneurysm 2. No filling defects or perfusion deficits seen on post-deployment angiogram 3. Good hemostasis of RCFA 79F access site with 32F ExoSeal device

## 2013-08-12 NOTE — Transfer of Care (Signed)
Immediate Anesthesia Transfer of Care Note  Patient: Kristin Stewart  Procedure(s) Performed: Procedure(s): EMBOLIZATION (N/A)  Patient Location: PACU  Anesthesia Type:General  Level of Consciousness: awake, alert  and oriented  Airway & Oxygen Therapy: Patient Spontanous Breathing and Patient connected to nasal cannula oxygen  Post-op Assessment: Report given to PACU RN, Post -op Vital signs reviewed and stable, Patient moving all extremities X 4 and Patient able to stick tongue midline  Post vital signs: Reviewed and stable  Complications: No apparent anesthesia complications

## 2013-08-12 NOTE — H&P (View-Only) (Signed)
History of Present Illness:   [name] is a 43 year old woman seen for the first time in the office. She has a history of multiple sclerosis, and has recently switched doctors. In an attempt to consolidate medications, she underwent repeat MRI scan of her brain which demonstrate of the possibility of an intracranial aneurysm. Further workup included MR and of the brain which did demonstrate a likely left-sided periophthalmic internal carotid artery aneurysm, and she presents for evaluation of this. She has no clinical symptoms, denying headache, visual changes, new numbness tingling or weakness in the extremities. In fact, she is also a symptomatically from her multiple sclerosis, which has been followed radiographically primarily.    PAST MEDICAL/SURGICAL HISTORY (Detailed)  Disease/disorder Onset Date Management Date Comments    tubal ligation 2009     Tonsillectomy 1988     breast augmentation     PAST MEDICAL HISTORY, SURGICAL HISTORY, FAMILY HISTORY, SOCIAL HISTORY AND REVIEW OF SYSTEMS  I have reviewed the patient's past medical, surgical, family and social history as well as the comprehensive review of systems as included on the [facility] history form dated 06/23/2013, which I have signed.  Family History (Detailed)  Relationship Family Member Name Deceased Age at Death Condition Onset Age Cause of Death  Father    Hypertension  N  Mother    Hypertension  N  SOCIAL HISTORY (Detailed)  Tobacco use reviewed.  Preferred language is Unknown.  Smoking status: Never smoker.  SMOKING STATUS  Use Status Type Smoking Status Usage Per Day Years Used Total Pack Years  no/never  Never smoker      MEDICATIONS(added, continued or stopped this visit):    Started Medication Directions Instruction Stopped   ADVIL take 2 tablet by oral route every 4 - 6 hours as needed with food     Avonex 30 mcg intramuscular kit inject (30MCG) by intramuscular route every week      clonazepam 0.5 mg tablet take 1 tablet by oral route every day     Effexor XR 75 mg capsule,extended release take 1 capsule by oral route every day with food     multivitamin tablet      Topamax 100 mg tablet take 2 tablet by oral route 2 times every day     ALLERGIES:  Ingredient Reaction Medication Name Comment  NO KNOWN ALLERGIES     No known allergies.  Vitals  Date Temp F BP Pulse Ht In Wt Lb BMI BSA Pain Score  06/23/2013  133/84 87 65 145 24.13  0/10   PHYSICAL EXAM  General  Level of Distress: no acute distress  Overall Appearance: Normal  Neurological  Orientation: normal  Attention Span and Concentration: normal  Language: Fluent, appropriate  Musculoskeletal  Gait and Station: normal  Best boy  .Any abnormal findings will be noted below..  Right Left  Deltoid: 5/5 5/5  Biceps: 5/5 5/5  Triceps: 5/5 5/5  Infraspinatus: 5/5 5/5  Wrist Extensor: 5/5 5/5  Grip: 5/5 5/5  Hip Flexor: 5/5 5/5  Knee Extensor: 5/5 5/5  Tib Anterior: 5/5 5/5  EHL: 5/5 5/5  Medial Gastroc: 5/5 5/5  Deep Tendon Reflexes  Right Left  Biceps: normal increase  Triceps: normal increase  Brachiloradialis: normal increase  Patellar: normal increase  Achilles: normal increase  Sensory  Sensation was tested at C2 to T1 and L1 to S1.  Cranial Nerves  II. Optic Nerve/Visual Fields: bilateral normal  III. Oculomotor: bilateral EOMI  IV.  Trochlear: bilateral EOMI  V. Trigeminal: bilateral Facial sensation intact  VI. Abducens: bilateral EOMI  VII. Facial: bilateral normal  VIII. Acoustic/Vestibular: bilateral Intact to finger rub  IX. Glossopharyngeal: bilateral Palate elevates symetrically  X. Vagus: bilateral Uvula midline  XI. Spinal Accessory: bilateral Normal shoulder shrug  XII. Hypoglossal: bilateral Tongue midline   DIAGNOSTIC RESULTS  MRA of the brain demonstrates an approximately 8 mm left-sided ophthalmic internal carotid artery aneurysm, projecting superiorly.    IMPRESSION  43 year old woman with incidental discovery of paraophthalmic ICA aneurysm.   PLAN - Diagnostic cerebral angiogram for definitive diagnosis and characterization of the aneurysm.   A comprehensive discussion was had with the patient and her family including husband and mother totaling approximately 30 minutes in the office. The natural history of cerebral aneurysms was reviewed in detail, with a 1-2% per year risk of rupture. The general treatment options were also discussed, with the need for definitive diagnosis by catheter angiogram. The risks of the angiogram procedure were reviewed, including a 0.1% risk of stroke, and overal risk of approximately 1% including but not limited to groin hematoma, headache, contrast reaction, and nephropathy.   The patient understood our discussion and is willing to proceed as above.

## 2013-08-12 NOTE — Interval H&P Note (Signed)
History and Physical Interval Note:  08/12/2013 8:41 AM  Mrs. Georgetta Haberamra S Chenevert is a 43 y.o. woman who had incidental discovery of a left paraophthalmic ICA aneurysm confirmed on diagnostic angiogram. The treatment options and risks and benefits of each were discussed in detail with the patient and her family in the office. She now presents for Pipeline embolization of the LICA aneurysm. All their questions were answered, and consent was obtained.   Lisbeth RenshawNeelesh Brier Reid

## 2013-08-12 NOTE — Anesthesia Postprocedure Evaluation (Signed)
Anesthesia Post Note  Patient: Kristin Stewart  Procedure(s) Performed: Procedure(s) (LRB): EMBOLIZATION (N/A)  Anesthesia type: General  Patient location: PACU  Post pain: Pain level controlled and Adequate analgesia  Post assessment: Post-op Vital signs reviewed, Patient's Cardiovascular Status Stable, Respiratory Function Stable, Patent Airway and Pain level controlled  Last Vitals:  Filed Vitals:   08/12/13 1200  BP: 119/74  Pulse: 94  Temp: 36.9 C  Resp: 23    Post vital signs: Reviewed and stable  Level of consciousness: awake, alert  and oriented  Complications: No apparent anesthesia complications

## 2013-08-12 NOTE — Anesthesia Procedure Notes (Signed)
Procedure Name: Intubation Date/Time: 08/12/2013 9:09 AM Performed by: Ollen Bowl Pre-anesthesia Checklist: Patient identified, Emergency Drugs available, Suction available, Patient being monitored and Timeout performed Patient Re-evaluated:Patient Re-evaluated prior to inductionOxygen Delivery Method: Circle system utilized and Simple face mask Preoxygenation: Pre-oxygenation with 100% oxygen Intubation Type: IV induction Ventilation: Mask ventilation without difficulty Laryngoscope Size: Mac and 3 Grade View: Grade IV Tube type: Oral Tube size: 7.5 mm Number of attempts: 2 Airway Equipment and Method: Patient positioned with wedge pillow,  Stylet and LTA kit utilized Placement Confirmation: ETT inserted through vocal cords under direct vision,  positive ETCO2 and breath sounds checked- equal and bilateral Secured at: 22 cm Tube secured with: Tape Dental Injury: Teeth and Oropharynx as per pre-operative assessment  Difficulty Due To: Difficulty was unanticipated and Difficult Airway- due to anterior larynx Future Recommendations: Recommend- induction with short-acting agent, and alternative techniques readily available

## 2013-08-13 ENCOUNTER — Observation Stay (HOSPITAL_COMMUNITY): Payer: BC Managed Care – PPO

## 2013-08-13 MED ORDER — DEXAMETHASONE SODIUM PHOSPHATE 10 MG/ML IJ SOLN
INTRAMUSCULAR | Status: AC
  Filled 2013-08-13: qty 1

## 2013-08-13 MED ORDER — IOHEXOL 350 MG/ML SOLN
50.0000 mL | Freq: Once | INTRAVENOUS | Status: AC | PRN
  Administered 2013-08-13: 50 mL via INTRAVENOUS

## 2013-08-13 MED ORDER — DEXAMETHASONE SODIUM PHOSPHATE 4 MG/ML IJ SOLN
4.0000 mg | Freq: Four times a day (QID) | INTRAMUSCULAR | Status: DC
  Administered 2013-08-13 – 2013-08-16 (×14): 4 mg via INTRAVENOUS
  Filled 2013-08-13 (×14): qty 1

## 2013-08-13 NOTE — Progress Notes (Signed)
Pt seen and examined. No issues overnight. This am pt c/o blurry vision with both eyes open, and the vision in her left eye is a little "off."  EXAM: Temp:  [97.4 F (36.3 C)-98.8 F (37.1 C)] 97.4 F (36.3 C) (04/24 0800) Pulse Rate:  [63-94] 68 (04/24 1200) Resp:  [12-22] 16 (04/24 1200) BP: (99-144)/(55-96) 131/75 mmHg (04/24 1200) SpO2:  [98 %-100 %] 99 % (04/24 1200) Arterial Line BP: (80-165)/(56-123) 122/111 mmHg (04/24 0800) Intake/Output     04/23 0701 - 04/24 0700 04/24 0701 - 04/25 0700   I.V. (mL/kg) 2937.5 (43.1) 375 (5.5)   Total Intake(mL/kg) 2937.5 (43.1) 375 (5.5)   Urine (mL/kg/hr) 3350 (2) 250 (0.6)   Blood 50 (0)    Total Output 3400 250   Net -462.5 +125         Awake, alert, oriented Speech fluent CN grossly intact   EOMI  No APD 5/5 BUE/BLE, no drift  LABS: Lab Results  Component Value Date   CREATININE 0.57 08/12/2013   BUN 19 08/12/2013   NA 143 08/12/2013   K 3.5* 08/12/2013   CL 108 08/12/2013   CO2 21 08/12/2013   Lab Results  Component Value Date   WBC 9.5 08/12/2013   HGB 13.1 08/12/2013   HCT 37.9 08/12/2013   MCV 91.8 08/12/2013   PLT 307 08/12/2013    IMAGING: CT/CTA reviewed, LICA patent as is LACA/LMCA. Aneurysm filling appears unchanged.  IMPRESSION: 28- 42 y.o. 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 - Mobilize OOB

## 2013-08-13 NOTE — Progress Notes (Signed)
UR completed. Patient changed to inpatient- requiring IVF @ 75cc/hr and IV decadron.

## 2013-08-14 NOTE — Progress Notes (Signed)
Patient ID: Kristin Stewart, female   DOB: 01-Mar-1971, 43 y.o.   MRN: [id number] Doing well . Left diplopia but able to have FOM. NO WEAKNESS. NO HEADACHE. Po fluid intake. Continue iv steroids.

## 2013-08-15 NOTE — Progress Notes (Signed)
Patient ID: Kristin Stewart, female   DOB: 1970/09/20, 43 y.o.   MRN: [id number] Afeb,vss No new neuro issues Still with diplopia. Otherwise, neuro stable Needs to increase activity. Encouraged to increase po intake. Will continue present rx.

## 2013-08-15 NOTE — Evaluation (Signed)
Physical Therapy Evaluation Patient Details Name: Kristin Stewart MRN: [id number] DOB: [birth year] Today's Date: 08/15/2013   History of Present Illness  43 y.o. female who lives in [place] (but who recieves all of her healthcare from [place] because this is where she is from) was admitted to [facility] 08/12/13 for elective cerebral angiogram for an aneurysm that was found on a routine MRI (per pt to track her MS).  Of note, PTA, pt was treating herself for what she thought was positional vertigo and since her angiogram she has had double vision (for which she has also self treated by patching her left eye).  She is a Surveyor, miningphysician's assistant by trade.    Clinical Impression  Pt with significant gait instability, double vision (unless one eye is occluded left or right, it doesn't matter takes double vision away).  Even with one eye patched she is still significantly unsteady on her feet staggering both left and right requiring min to mod assist to prevent a fall.  I would like to proceed with a formal vestibular assessment, but will need permission from her surgeon as I am not sure if there is any contraindication of putting her head in a dependent position like I would need to do [facility] Hallpike testing or Epley's maneuver.  She will need vestibular f/u at an OP clinic at discharge. I will research clinics in the [place] area.   PT to follow acutely for deficits listed below.       Follow Up Recommendations Outpatient PT;Supervision for mobility/OOB (OP PT for vestibular rehab. )    Equipment Recommendations  Rolling walker with 5" wheels    Recommendations for Other Services OT consult     Precautions / Restrictions Precautions Precautions: Fall Precaution Comments: pt is very unsteady on her feet      Mobility  Bed Mobility Overal bed mobility: Modified Independent             General bed mobility comments: using bed rail for leverage to get to sitting  Transfers Overall transfer level:  Needs assistance Equipment used: None Transfers: Sit to/from Stand Sit to Stand: Min assist         General transfer comment: pt reaching for external stability of bed rail and husband when going to stand.  She is relying heavily on bil lower extremities supported on the bed to maintain her balance.   Ambulation/Gait Ambulation/Gait assistance: Min guard;Mod assist Ambulation Distance (Feet): 100 Feet Assistive device: 1 person hand held assist Gait Pattern/deviations: Step-through pattern;Staggering right;Staggering left Gait velocity: decreased Gait velocity interpretation: Below normal speed for age/gender General Gait Details: Pt with staggering gait pattern even with one eye patched.  It was no better with right vs left eye patched.  Pt is frequently seeking support of hallway railing and her husband's hand.           Balance Overall balance assessment: Needs assistance Sitting-balance support: No upper extremity supported;Feet supported Sitting balance-Leahy Scale: Good   Postural control: Posterior lean Standing balance support: No upper extremity supported Standing balance-Leahy Scale: Poor                               Pertinent Vitals/Pain See vitals flow sheet.     Home Living Family/patient expects to be discharged to:: Private residence Living Arrangements: Spouse/significant other Available Help at Discharge: Family Type of Home: House Home Access: Stairs to enter Entrance Stairs-Rails: Right;Left;Can reach both  Entrance Stairs-Number of Steps: 5 Home Layout: Two level;Full bath on main level Home Equipment: None Additional Comments: Husband works full time days and can take some     Prior Function Level of Independence: Independent         Comments: drives, worked until last week, quit her job,      Higher education careers adviser Dominance   Dominant Hand: Right    Extremity/Trunk Assessment   Upper Extremity Assessment: Overall WFL for tasks  assessed           Lower Extremity Assessment: Overall WFL for tasks assessed      Cervical / Trunk Assessment: Normal  Communication   Communication: No difficulties  Cognition Arousal/Alertness: Awake/alert Behavior During Therapy: WFL for tasks assessed/performed Overall Cognitive Status: Within Functional Limits for tasks assessed                      General Comments General comments (skin integrity, edema, etc.): Occulomotor testing revealed poor gaze stability bil with right beating nystagmus in her left eye (with right eye occluded) and rotational nystagmus in her right eye (with left eye occluded).  Pt with both eyes open has double vision (horizontal).  I asked RN to order her a real eye patch and then instructed the pt to rotate the eye that has the patch so that both eyes can get a workout, not just one.  Per pt report she had self dx with BPPV and was preforming Austin Miles exercises on herself with significant improvement PTA, but reports she was not quite back to normal before she had surgery.  I will need permission from the surgeon to proceed with formal vestublar testing as her head will be in a dependent position with increased blood flow and I am not sure if that is safe to do after her angiogram.            Assessment/Plan    PT Assessment Patient needs continued PT services  PT Diagnosis Difficulty walking;Abnormality of gait;Generalized weakness;Acute pain   PT Problem List Decreased strength;Decreased activity tolerance;Decreased balance;Decreased mobility;Decreased coordination;Decreased knowledge of use of DME  PT Treatment Interventions DME instruction;Gait training;Stair training;Therapeutic activities;Functional mobility training;Therapeutic exercise;Balance training;Neuromuscular re-education;Patient/family education   PT Goals (Current goals can be found in the Care Plan section) Acute Rehab PT Goals Patient Stated Goal: to stop being so  dizzy PT Goal Formulation: With patient/family Time For Goal Achievement: 08/29/13 Potential to Achieve Goals: Good    Frequency Min 4X/week        End of Session Equipment Utilized During Treatment: Gait belt Activity Tolerance: Other (comment) (pt limited by dizziness, double vision and dysequilibium) Patient left: in chair;with call bell/phone within reach;with family/visitor present Nurse Communication: Mobility status (needs an eye patch)        Time: 2841-3244 PT Time Calculation (min): 34 min   Charges:   PT Evaluation $Initial PT Evaluation Tier I: 1 Procedure PT Treatments $Gait Training: 8-22 mins        Yorley Buch B. Jacques Willingham, PT, DPT [phone]   08/15/2013, 6:09 PM

## 2013-08-16 MED ORDER — METHYLPREDNISOLONE (PAK) 4 MG PO TABS: ORAL_TABLET | ORAL | Status: DC

## 2013-08-16 NOTE — Progress Notes (Signed)
Talked to patient about DCP; physical therapy recommends outpatient therapy, patient stated that she does not want that now and if she would change her mind, her PCP would make arrangements for therapy; [facility] called for DME; Alexis Goodell [phone]

## 2013-08-16 NOTE — Progress Notes (Signed)
Pt A&O x4; pt discharge education and instructions completed with pt and family members in the room. All denies any questions and voices understanding. All lines including pt IV removed from pt. Pt given handout print information on aneurysm along with her prescription order for prednisone. Pt awaiting on ordered home equipments to be delivered. Will continue to monitor pt till she leaves the unit. Arabella MerlesP. Amo Breslyn Abdo RN.

## 2013-08-16 NOTE — Discharge Summary (Signed)
  Physician Discharge Summary  Patient ID: Kristin Stewart MRN: [id number] DOB/AGE: 07/30/1970 43 y.o.  Admit date: 08/12/2013 Discharge date: 08/16/2013  Admission Diagnoses: LICA aneurysm  Discharge Diagnoses: Same Active Problems:   Cerebral aneurysm, nonruptured   Discharged Condition: Stable  Hospital Course:  [name] is a 43 y.o. 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 She was monitored postoperatively in the intensive care unit where she was found to be at her neurologic baseline, intact. On the morning of postoperative day #1, she did complain of some diplopia which was not present with only 1I open. She was placed on Decadron, and transferred to the neuroscience floor for continued observation. Repeat CT and CTA scan was completed which did not demonstrate any acute infarction, and patency of the major intracranial vessels. Over the weekend, she remained stable, was ambulating with some mild imbalance due to the double vision, but was otherwise well. She was therefore deemed stable for discharge.  Treatments: Surgery - pipeline embolization of left internal carotid artery aneurysm  Discharge Exam: Blood pressure 120/64, pulse 65, temperature 97.9 F (36.6 C), temperature source Oral, resp. rate 20, height 5\' 5"  (1.651 m), weight 68.1 kg (150 lb 2.1 oz), last menstrual period 07/18/2013, SpO2 100.00%. Awake, alert, oriented Speech fluent, appropriate CN grossly intact 5/5 BUE/BLE Wound c/d/i  Follow-up: Follow-up in my office [facility] [phone]) in one week  Disposition:    Future Appointments Provider Department Dept Phone   09/08/2013 11:00 AM Levert Feinstein, MD [facility] [phone]       Medication List         aspirin 325 MG EC tablet  Take 325 mg by mouth daily.     bismuth subsalicylate 262 MG chewable tablet  Commonly known  as:  PEPTO BISMOL  Chew 524 mg by mouth 2 (two) times daily as needed for diarrhea or loose stools.     clonazePAM 0.5 MG tablet  Commonly known as:  KLONOPIN  Take 0.5 mg by mouth at bedtime as needed for anxiety.     clopidogrel 75 MG tablet  Commonly known as:  PLAVIX  Take 75 mg by mouth daily with breakfast.     Dimethyl Fumarate 120 & 240 MG Misc  Commonly known as:  TECFIDERA  Take 1 capsule by mouth 2 (two) times daily.     diphenhydrAMINE 25 MG tablet  Commonly known as:  BENADRYL  Take 25 mg by mouth 2 (two) times daily as needed (flushing). Takes with tecfidera     FISH OIL PO  Take 1 capsule by mouth at bedtime.     ibuprofen 200 MG tablet  Commonly known as:  ADVIL,MOTRIN  Take 600 mg by mouth every 6 (six) hours as needed for headache or moderate pain.     methylPREDNIsolone 4 MG tablet  Commonly known as:  MEDROL DOSPACK  follow package directions     MULTIVITAMIN PO  Take 1 tablet by mouth daily.     topiramate 25 MG tablet  Commonly known as:  TOPAMAX  Take 100 mg by mouth at bedtime.     venlafaxine XR 75 MG 24 hr capsule  Commonly known as:  EFFEXOR-XR  Take 75 mg by mouth daily with breakfast.         Signed: Lisbeth Renshaw 08/16/2013, 9:30 AM

## 2013-08-16 NOTE — Progress Notes (Signed)
PT Cancellation Note  Patient Details Name: Kristin Stewart MRN: [id number] DOB: [birth year]   Cancelled Treatment:    Reason Eval/Treat Not Completed: Other (comment) (pt d/cing home).  Pt given resources for OP Specialty clinics both here in [place] that work on balance and vestibular issues.  Pt would prefer to wait until her f/u appointment with MD to get further vestibular assessment (as she reports MD thinks it is related to inflammation around cranial nerves near her surgery site-once inflammation goes down symptoms should resolve).  Pt due to d/c home later today.  PT to follow acutely.  Pt will still need RW and BSC for home use as she continues to be unsteady.  She does have an eye patch now and reports surgeon also encouraged her to alternate sides.   Thanks,    Claretta Fraise Llewyn Heap 08/16/2013, 12:19 PM

## 2013-08-16 NOTE — Progress Notes (Signed)
Pt home equipments delivered to pt in room. Pt transported off unit via wheelchair with family and belongings at side. P.Amo Glorine Hanratty RN.

## 2013-08-17 ENCOUNTER — Encounter (HOSPITAL_COMMUNITY): Payer: Self-pay | Admitting: Neurosurgery

## 2013-09-08 ENCOUNTER — Ambulatory Visit: Payer: BC Managed Care – PPO | Admitting: Neurology

## 2013-10-21 ENCOUNTER — Telehealth: Payer: Self-pay | Admitting: Neurology

## 2013-10-29 NOTE — Telephone Encounter (Signed)
Error

## 2014-01-04 ENCOUNTER — Other Ambulatory Visit (HOSPITAL_COMMUNITY): Payer: Self-pay | Admitting: Neurosurgery

## 2014-01-04 DIAGNOSIS — I609 Nontraumatic subarachnoid hemorrhage, unspecified: Secondary | ICD-10-CM

## 2014-01-27 ENCOUNTER — Encounter (HOSPITAL_COMMUNITY): Payer: Self-pay | Admitting: Pharmacy Technician

## 2014-02-04 ENCOUNTER — Other Ambulatory Visit (HOSPITAL_COMMUNITY): Payer: Self-pay | Admitting: Neurosurgery

## 2014-02-04 ENCOUNTER — Ambulatory Visit (HOSPITAL_COMMUNITY)
Admission: RE | Admit: 2014-02-04 | Discharge: 2014-02-04 | Disposition: A | Discharge status: Home / Self Care | Payer: BC Managed Care – PPO | Source: Ambulatory Visit | Attending: Neurosurgery | Admitting: Neurosurgery

## 2014-02-04 DIAGNOSIS — G35 Multiple sclerosis: Secondary | ICD-10-CM | POA: Diagnosis not present

## 2014-02-04 DIAGNOSIS — Z7902 Long term (current) use of antithrombotics/antiplatelets: Secondary | ICD-10-CM | POA: Diagnosis not present

## 2014-02-04 DIAGNOSIS — Z791 Long term (current) use of non-steroidal anti-inflammatories (NSAID): Secondary | ICD-10-CM | POA: Diagnosis not present

## 2014-02-04 DIAGNOSIS — Z7982 Long term (current) use of aspirin: Secondary | ICD-10-CM | POA: Insufficient documentation

## 2014-02-04 DIAGNOSIS — F419 Anxiety disorder, unspecified: Secondary | ICD-10-CM | POA: Diagnosis not present

## 2014-02-04 DIAGNOSIS — I671 Cerebral aneurysm, nonruptured: Secondary | ICD-10-CM | POA: Insufficient documentation

## 2014-02-04 DIAGNOSIS — I609 Nontraumatic subarachnoid hemorrhage, unspecified: Secondary | ICD-10-CM

## 2014-02-04 LAB — CBC WITH DIFFERENTIAL/PLATELET
Basophils Absolute: 0 10*3/uL (ref 0.0–0.1)
Basophils Relative: 1 % (ref 0–1)
EOS PCT: 3 % (ref 0–5)
Eosinophils Absolute: 0.2 10*3/uL (ref 0.0–0.7)
HEMATOCRIT: 37.7 % (ref 36.0–46.0)
Hemoglobin: 13.2 g/dL (ref 12.0–15.0)
LYMPHS PCT: 35 % (ref 12–46)
Lymphs Abs: 2.3 10*3/uL (ref 0.7–4.0)
MCH: 31.6 pg (ref 26.0–34.0)
MCHC: 35 g/dL (ref 30.0–36.0)
MCV: 90.2 fL (ref 78.0–100.0)
MONO ABS: 0.6 10*3/uL (ref 0.1–1.0)
Monocytes Relative: 9 % (ref 3–12)
Neutro Abs: 3.4 10*3/uL (ref 1.7–7.7)
Neutrophils Relative %: 52 % (ref 43–77)
Platelets: 287 10*3/uL (ref 150–400)
RBC: 4.18 MIL/uL (ref 3.87–5.11)
RDW: 12.9 % (ref 11.5–15.5)
WBC: 6.4 10*3/uL (ref 4.0–10.5)

## 2014-02-04 LAB — PROTIME-INR
INR: 1.03 (ref 0.00–1.49)
Prothrombin Time: 13.6 seconds (ref 11.6–15.2)

## 2014-02-04 LAB — URINALYSIS, ROUTINE W REFLEX MICROSCOPIC
Glucose, UA: NEGATIVE mg/dL
Hgb urine dipstick: NEGATIVE
Ketones, ur: NEGATIVE mg/dL
Leukocytes, UA: NEGATIVE
Nitrite: NEGATIVE
Protein, ur: NEGATIVE mg/dL
Specific Gravity, Urine: 1.028 (ref 1.005–1.030)
Urobilinogen, UA: 0.2 mg/dL (ref 0.0–1.0)
pH: 5.5 (ref 5.0–8.0)

## 2014-02-04 LAB — BASIC METABOLIC PANEL
Anion gap: 15 (ref 5–15)
BUN: 16 mg/dL (ref 6–23)
CALCIUM: 9.3 mg/dL (ref 8.4–10.5)
CO2: 20 meq/L (ref 19–32)
Chloride: 106 mEq/L (ref 96–112)
Creatinine, Ser: 0.6 mg/dL (ref 0.50–1.10)
GFR calc Af Amer: >90 mL/min (ref >90)
GFR calc non Af Amer: >90 mL/min (ref >90)
GLUCOSE: 93 mg/dL (ref 70–99)
Potassium: 3.6 mEq/L — ABNORMAL LOW (ref 3.7–5.3)
SODIUM: 141 meq/L (ref 137–147)

## 2014-02-04 LAB — APTT: aPTT: 31 seconds (ref 24–37)

## 2014-02-04 MED ORDER — HEPARIN SOD (PORK) LOCK FLUSH 100 UNIT/ML IV SOLN
INTRAVENOUS | Status: AC
  Filled 2014-02-04: qty 25

## 2014-02-04 MED ORDER — IOHEXOL 300 MG/ML  SOLN
150.0000 mL | Freq: Once | INTRAMUSCULAR | Status: AC | PRN
Start: 2014-02-04 — End: 2014-02-04
  Administered 2014-02-04: 80 mL via INTRAVENOUS

## 2014-02-04 MED ORDER — FENTANYL CITRATE 0.05 MG/ML IJ SOLN
INTRAMUSCULAR | Status: AC
  Filled 2014-02-04: qty 2

## 2014-02-04 MED ORDER — HEPARIN SODIUM (PORCINE) 1000 UNIT/ML IJ SOLN
INTRAMUSCULAR | Status: AC | PRN
  Administered 2014-02-04: 2000 [IU] via INTRAVENOUS

## 2014-02-04 MED ORDER — MIDAZOLAM HCL 2 MG/2ML IJ SOLN
INTRAMUSCULAR | Status: AC | PRN
  Administered 2014-02-04: 0.5 mg via INTRAVENOUS

## 2014-02-04 MED ORDER — LIDOCAINE HCL 1 % IJ SOLN
INTRAMUSCULAR | Status: AC
  Filled 2014-02-04: qty 20

## 2014-02-04 MED ORDER — MIDAZOLAM HCL 2 MG/2ML IJ SOLN
INTRAMUSCULAR | Status: AC
  Filled 2014-02-04: qty 2

## 2014-02-04 MED ORDER — FENTANYL CITRATE 0.05 MG/ML IJ SOLN
INTRAMUSCULAR | Status: AC | PRN
  Administered 2014-02-04: 25 ug via INTRAVENOUS

## 2014-02-04 NOTE — Brief Op Note (Signed)
PREOP DX: LICA aneurysm s/p Pipeline emboliation  POSTOP DX: Same  PROCEDURE: Diagnostic cerebral angiogram  SURGEON: Dr. Lisbeth RenshawNeelesh Layah Skousen, MD  ANESTHESIA: IV Sedation with Local  EBL: Minimal  SPECIMENS: None  COMPLICATIONS: None  CONDITION: Stable to recovery  FINDINGS: 1. Complete occlusion of previously described LICA aneurysm is seen. No in-stent stenosis.

## 2014-02-04 NOTE — Discharge Instructions (Signed)
Angiogram, Care After ° °Refer to this sheet in the next few weeks. These instructions provide you with information on caring for yourself after your procedure. Your health care provider may also give you more specific instructions. Your treatment has been planned according to current medical practices, but problems sometimes occur. Call your health care provider if you have any problems or questions after your procedure.  °WHAT TO EXPECT AFTER THE PROCEDURE °After your procedure, it is typical to have the following sensations: °· Minor discomfort or tenderness and a small bump at the catheter insertion site. The bump should usually decrease in size and tenderness within 1 to 2 weeks. °· Any bruising will usually fade within 2 to 4 weeks. °HOME CARE INSTRUCTIONS  °· You may need to keep taking blood thinners if they were prescribed for you. Take medicines only as directed by your health care provider. °· Do not apply powder or lotion to the site. °· Do not take baths, swim, or use a hot tub until your health care provider approves. °· You may shower 24 hours after the procedure. Remove the bandage (dressing) and gently wash the site with plain soap and water. Gently pat the site dry. °· Inspect the site at least twice daily. °· Limit your activity for the first 24 hours. Do not bend, squat, or lift anything over 10 lb (9 kg) or as directed by your health care provider. °· Plan to have someone take you home after the procedure. Follow instructions about when you can drive or return to work. °SEEK MEDICAL CARE IF: °· You get light-headed when standing up. °· You have drainage (other than a small amount of blood on the dressing). °· You have chills. °· You have a fever. °· You have redness, warmth, swelling, or pain at the insertion site. °SEEK IMMEDIATE MEDICAL CARE IF:  °· You develop chest pain or shortness of breath, feel faint, or pass out. °· You have bleeding, swelling larger than a walnut, or drainage from the  catheter insertion site. °· You develop pain, discoloration, coldness, or severe bruising in the leg or arm that held the catheter. °· You have heavy bleeding from the site. If this happens, hold pressure on the site. °MAKE SURE YOU: °· Understand these instructions. °· Will watch your condition. °· Will get help right away if you are not doing well or get worse. °Document Released: 10/25/2004 Document Revised: 08/23/2013 Document Reviewed: 08/31/2012 °ExitCare® Patient Information ©2015 [employer]. This information is not intended to replace advice given to you by your health care provider. Make sure you discuss any questions you have with your health care provider. ° °

## 2014-02-04 NOTE — H&P (Signed)
CC:  Aneurysm f/u  HPI: Mr. 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 Immediately postoperatively, she did have some double vision which resolved after a few days, and returned a few weeks later. It took about 3-4 months, but this has again completely resolved. She remains on ASA/Plavix, and has no complaints.  PMH: Past Medical History  Diagnosis Date  . MS (multiple sclerosis)   . Migraine without aura, without mention of intractable migraine without mention of status migrainosus   . Cervicalgia   . Abnormal liver function tests   . PONV (postoperative nausea and vomiting)   . Anxiety   . Cerebral aneurysm without rupture   . Vertigo     PSH: Past Surgical History  Procedure Laterality Date  . Tonsillectomy and adenoidectomy  1988  . Tubal ligation  2009  . Breast enhancement surgery  2008  . Radiology with anesthesia N/A 08/12/2013    Procedure: EMBOLIZATION;  Surgeon: Lisbeth Renshaw, MD;  Location: [facility];  Service: Radiology;  Laterality: N/A;    SH: History  Substance Use Topics  . Smoking status: Never Smoker   . Smokeless tobacco: Never Used  . Alcohol Use: 0.6 oz/week    1 Glasses of wine per week     Comment: Once a month    MEDS: Prior to Admission medications   Medication Sig Start Date End Date Taking? Authorizing Provider  aspirin 325 MG EC tablet Take 325 mg by mouth daily.   Yes Historical Provider, MD  bismuth subsalicylate (PEPTO BISMOL) 262 MG chewable tablet Chew 524 mg by mouth 2 (two) times daily as needed for diarrhea or loose stools.   Yes Historical Provider, MD  clonazePAM (KLONOPIN) 0.5 MG tablet Take 0.5 mg by mouth at bedtime as needed for anxiety.    Yes Historical Provider, MD  clopidogrel (PLAVIX) 75 MG tablet Take 75 mg by mouth daily with breakfast.   Yes Historical Provider, MD  Dimethyl Fumarate (TECFIDERA) 120 & 240 MG MISC Take 1 capsule by mouth 2  (two) times daily. 06/21/13  Yes Levert Feinstein, MD  diphenhydrAMINE (BENADRYL) 25 MG tablet Take 25 mg by mouth 2 (two) times daily as needed (flushing). Takes with tecfidera   Yes Historical Provider, MD  ibuprofen (ADVIL,MOTRIN) 200 MG tablet Take 600 mg by mouth every 6 (six) hours as needed for headache or moderate pain.   Yes Historical Provider, MD  Multiple Vitamins-Minerals (MULTIVITAMIN PO) Take 1 tablet by mouth daily.    Yes Historical Provider, MD  Omega-3 Fatty Acids (FISH OIL PO) Take 1 capsule by mouth at bedtime.   Yes Historical Provider, MD  topiramate (TOPAMAX) 25 MG tablet Take 100 mg by mouth at bedtime.    Yes Historical Provider, MD  venlafaxine XR (EFFEXOR-XR) 75 MG 24 hr capsule Take 75 mg by mouth daily with breakfast.   Yes Historical Provider, MD    ALLERGY: Allergies  Allergen Reactions  . Percocet [Oxycodone-Acetaminophen] Other (See Comments)    hallucinations    ROS: Review of Systems  Constitutional: Negative for fever and chills.  HENT: Negative for nosebleeds.   Eyes: Negative for blurred vision, double vision and pain.  Respiratory: Negative for cough.   Cardiovascular: Negative for chest pain.  Gastrointestinal: Negative for heartburn, nausea and vomiting.  Genitourinary: Negative for dysuria and frequency.  Musculoskeletal: Negative for myalgias and neck pain.  Skin: Negative for rash.  Neurological: Negative for dizziness, tingling,  focal weakness, loss of consciousness and headaches.  Endo/Heme/Allergies: Does not bruise/bleed easily.  Psychiatric/Behavioral: Negative for depression.    NEUROLOGIC EXAM: Awake, alert, oriented Memory and concentration grossly intact Speech fluent, appropriate CN grossly intact Motor exam: Upper Extremities Deltoid Bicep Tricep Grip  Right 5/5 5/5 5/5 5/5  Left 5/5 5/5 5/5 5/5   Lower Extremity IP Quad PF DF EHL  Right 5/5 5/5 5/5 5/5 5/5  Left 5/5 5/5 5/5 5/5 5/5   Sensation grossly intact to  LT  IMPRESSION: - 43 y.o. female 6months s/p Pipeline embolization of LICA aneurysm, for routine f/u.  PLAN: - Diagnostic cerebral angiogram - Likely home post-procedure

## 2014-02-04 NOTE — Progress Notes (Signed)
[id number] Dr Conchita ParisNundkumar advised that patient has not stopped Plavix or aspirin.  She did in fact have doses of both this am.  No further orders

## 2014-02-04 NOTE — Sedation Documentation (Signed)
Pt states her LMP was 01/31/14

## 2014-02-17 ENCOUNTER — Telehealth: Payer: Self-pay | Admitting: Neurology

## 2014-02-17 NOTE — Telephone Encounter (Signed)
Beth with [facility] @ [phone], stated they have made two unsuccessful attempt's to reach out to patient for Rx Dimethyl Fumarate (TECFIDERA) 120 & 240 MG MISC delivery.  Will make third and final attempt on Monday, if no response order will be placed on hold.  FYI

## 2014-02-17 NOTE — Telephone Encounter (Signed)
I called the pharmacy back.  Spoke with Marcelino DusterMichelle.  She was not able to assist me and transferred me to [place].  Relayed patient is not longer seen at our practice.  They will notate account, and attempt to contact patient for new MD info.  I called the patient back as well.  Got no answer.

## 2014-02-17 NOTE — Telephone Encounter (Signed)
Shanda Bumps, please call back patient and her pharmacy, she is no longer a patient of [facility], will not continue to write RX for her.

## 2014-03-01 ENCOUNTER — Telehealth: Payer: Self-pay | Admitting: Neurology

## 2014-03-01 NOTE — Telephone Encounter (Signed)
This Patient has been dismissed from this practice . I don't  Any paper work for this patient .

## 2014-03-01 NOTE — Telephone Encounter (Signed)
Please advise on f/u appt with dr Terrace Arabiayan for this patient, [facility] 02[facility]15/14

## 2014-03-02 ENCOUNTER — Encounter: Payer: Self-pay | Admitting: Neurology

## 2014-05-19 ENCOUNTER — Other Ambulatory Visit (HOSPITAL_COMMUNITY): Payer: Self-pay | Admitting: Neurosurgery

## 2014-05-23 ENCOUNTER — Other Ambulatory Visit (HOSPITAL_COMMUNITY): Payer: Self-pay | Admitting: Neurosurgery

## 2014-05-23 DIAGNOSIS — I671 Cerebral aneurysm, nonruptured: Secondary | ICD-10-CM

## 2014-07-18 ENCOUNTER — Other Ambulatory Visit (HOSPITAL_COMMUNITY): Payer: Self-pay | Admitting: Neurosurgery

## 2014-07-29 ENCOUNTER — Ambulatory Visit (HOSPITAL_COMMUNITY): Payer: Self-pay

## 2014-08-06 ENCOUNTER — Other Ambulatory Visit: Payer: Self-pay | Admitting: Gastroenterology

## 2014-08-30 ENCOUNTER — Ambulatory Visit (HOSPITAL_COMMUNITY)
Admission: RE | Admit: 2014-08-30 | Discharge: 2014-08-30 | Disposition: A | Discharge status: Home / Self Care | Payer: BLUE CROSS/BLUE SHIELD | Source: Ambulatory Visit | Attending: Neurosurgery | Admitting: Neurosurgery

## 2014-08-30 DIAGNOSIS — I671 Cerebral aneurysm, nonruptured: Secondary | ICD-10-CM

## 2014-08-31 ENCOUNTER — Other Ambulatory Visit: Payer: Self-pay | Admitting: Neurosurgery

## 2014-11-01 ENCOUNTER — Ambulatory Visit (HOSPITAL_COMMUNITY)
Admission: RE | Admit: 2014-11-01 | Discharge: 2014-11-01 | Disposition: A | Discharge status: Home / Self Care | Payer: BLUE CROSS/BLUE SHIELD | Source: Ambulatory Visit | Attending: Neurosurgery | Admitting: Neurosurgery

## 2014-11-01 ENCOUNTER — Other Ambulatory Visit (HOSPITAL_COMMUNITY): Payer: Self-pay | Admitting: Neurosurgery

## 2014-11-01 DIAGNOSIS — F419 Anxiety disorder, unspecified: Secondary | ICD-10-CM | POA: Insufficient documentation

## 2014-11-01 DIAGNOSIS — Z7982 Long term (current) use of aspirin: Secondary | ICD-10-CM | POA: Diagnosis not present

## 2014-11-01 DIAGNOSIS — G35 Multiple sclerosis: Secondary | ICD-10-CM | POA: Insufficient documentation

## 2014-11-01 DIAGNOSIS — I671 Cerebral aneurysm, nonruptured: Secondary | ICD-10-CM | POA: Diagnosis not present

## 2014-11-01 DIAGNOSIS — Z48812 Encounter for surgical aftercare following surgery on the circulatory system: Secondary | ICD-10-CM | POA: Insufficient documentation

## 2014-11-01 DIAGNOSIS — G43909 Migraine, unspecified, not intractable, without status migrainosus: Secondary | ICD-10-CM | POA: Diagnosis not present

## 2014-11-01 DIAGNOSIS — R7989 Other specified abnormal findings of blood chemistry: Secondary | ICD-10-CM | POA: Diagnosis not present

## 2014-11-01 LAB — BASIC METABOLIC PANEL
Anion gap: 10 (ref 5–15)
BUN: 13 mg/dL (ref 6–20)
CO2: 21 mmol/L — ABNORMAL LOW (ref 22–32)
Calcium: 9.2 mg/dL (ref 8.9–10.3)
Chloride: 106 mmol/L (ref 101–111)
Creatinine, Ser: 0.66 mg/dL (ref 0.44–1.00)
GFR calc Af Amer: >60 mL/min (ref >60)
GFR calc non Af Amer: >60 mL/min (ref >60)
Glucose, Bld: 87 mg/dL (ref 65–99)
Potassium: 4.5 mmol/L (ref 3.5–5.1)
Sodium: 137 mmol/L (ref 135–145)

## 2014-11-01 LAB — CBC WITH DIFFERENTIAL/PLATELET
Basophils Absolute: 0.1 10*3/uL (ref 0.0–0.1)
Basophils Relative: 1 % (ref 0–1)
Eosinophils Absolute: 0.2 10*3/uL (ref 0.0–0.7)
Eosinophils Relative: 3 % (ref 0–5)
HCT: 37.3 % (ref 36.0–46.0)
HEMOGLOBIN: 13.1 g/dL (ref 12.0–15.0)
Lymphocytes Relative: 25 % (ref 12–46)
Lymphs Abs: 1.3 10*3/uL (ref 0.7–4.0)
MCH: 31 pg (ref 26.0–34.0)
MCHC: 35.1 g/dL (ref 30.0–36.0)
MCV: 88.4 fL (ref 78.0–100.0)
Monocytes Absolute: 0.5 10*3/uL (ref 0.1–1.0)
Monocytes Relative: 9 % (ref 3–12)
Neutro Abs: 3.3 10*3/uL (ref 1.7–7.7)
Neutrophils Relative %: 62 % (ref 43–77)
Platelets: 273 10*3/uL (ref 150–400)
RBC: 4.22 MIL/uL (ref 3.87–5.11)
RDW: 13.3 % (ref 11.5–15.5)
WBC: 5.2 10*3/uL (ref 4.0–10.5)

## 2014-11-01 LAB — APTT: APTT: 28 s (ref 24–37)

## 2014-11-01 LAB — PROTIME-INR
INR: 1.03 (ref 0.00–1.49)
Prothrombin Time: 13.7 seconds (ref 11.6–15.2)

## 2014-11-01 MED ORDER — LIDOCAINE HCL 1 % IJ SOLN
INTRAMUSCULAR | Status: AC
  Filled 2014-11-01: qty 20

## 2014-11-01 MED ORDER — SODIUM CHLORIDE 0.9 % IV SOLN: INTRAVENOUS | Status: DC

## 2014-11-01 MED ORDER — HEPARIN SOD (PORK) LOCK FLUSH 100 UNIT/ML IV SOLN
INTRAVENOUS | Status: AC
  Filled 2014-11-01: qty 10

## 2014-11-01 MED ORDER — HEPARIN SOD (PORK) LOCK FLUSH 100 UNIT/ML IV SOLN
INTRAVENOUS | Status: AC | PRN
  Administered 2014-11-01: 2000 [IU] via INTRAVENOUS

## 2014-11-01 MED ORDER — MIDAZOLAM HCL 2 MG/2ML IJ SOLN
INTRAMUSCULAR | Status: AC
  Filled 2014-11-01: qty 2

## 2014-11-01 MED ORDER — IOHEXOL 300 MG/ML  SOLN
100.0000 mL | Freq: Once | INTRAMUSCULAR | Status: AC | PRN
  Administered 2014-11-01: 50 mL via INTRAVENOUS

## 2014-11-01 MED ORDER — FENTANYL CITRATE (PF) 100 MCG/2ML IJ SOLN
INTRAMUSCULAR | Status: AC
  Filled 2014-11-01: qty 2

## 2014-11-01 MED ORDER — HYDROCODONE-ACETAMINOPHEN 5-325 MG PO TABS: 1.0000 | ORAL_TABLET | ORAL | Status: DC | PRN

## 2014-11-01 NOTE — Sedation Documentation (Signed)
Vital signs stable. 

## 2014-11-01 NOTE — Sedation Documentation (Signed)
Patient denies pain and is resting comfortably.  

## 2014-11-01 NOTE — H&P (Signed)
CC:  Aneurysm  HPI: Kristin Stewart is a 44 y.o. 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 Her f/u angiogram at 26mo showed occlusion of the aneurysm without in-stent stenosis. She presents today for routine 18yr f/u angio. She has no new c/o.  PMH: Past Medical History  Diagnosis Date  . MS (multiple sclerosis)   . Migraine without aura, without mention of intractable migraine without mention of status migrainosus   . Cervicalgia   . Abnormal liver function tests   . PONV (postoperative nausea and vomiting)   . Anxiety   . Cerebral aneurysm without rupture   . Vertigo     PSH: Past Surgical History  Procedure Laterality Date  . Tonsillectomy and adenoidectomy  1988  . Tubal ligation  2009  . Breast enhancement surgery  2008  . Radiology with anesthesia N/A 08/12/2013    Procedure: EMBOLIZATION;  Surgeon: Lisbeth Renshaw, MD;  Location: [facility] OR;  Service: Radiology;  Laterality: N/A;    SH: History  Substance Use Topics  . Smoking status: Never Smoker   . Smokeless tobacco: Never Used  . Alcohol Use: 0.6 oz/week    1 Glasses of wine per week     Comment: Once a month    MEDS: Prior to Admission medications   Medication Sig Start Date End Date Taking? Authorizing Provider  aspirin 325 MG EC tablet Take 325 mg by mouth daily.   Yes Historical Provider, MD  bismuth subsalicylate (PEPTO BISMOL) 262 MG chewable tablet Chew 524 mg by mouth 2 (two) times daily as needed for diarrhea or loose stools.   Yes Historical Provider, MD  clonazePAM (KLONOPIN) 0.5 MG tablet Take 0.5 mg by mouth at bedtime as needed for anxiety.    Yes Historical Provider, MD  Dimethyl Fumarate (TECFIDERA) 120 & 240 MG MISC Take 1 capsule by mouth 2 (two) times daily. 06/21/13  Yes Levert Feinstein, MD  diphenhydrAMINE (BENADRYL) 25 MG tablet Take 25 mg by mouth 2 (two) times daily as needed (flushing). Takes with tecfidera   Yes Historical Provider, MD  ibuprofen (ADVIL,MOTRIN) 200 MG  tablet Take 600 mg by mouth every 6 (six) hours as needed for headache or moderate pain.   Yes Historical Provider, MD  Multiple Vitamins-Minerals (MULTIVITAMIN PO) Take 1 tablet by mouth daily.    Yes Historical Provider, MD  topiramate (TOPAMAX) 50 MG tablet Take 100 mg by mouth at bedtime.   Yes Historical Provider, MD  venlafaxine XR (EFFEXOR-XR) 75 MG 24 hr capsule Take 75 mg by mouth daily with breakfast.   Yes Historical Provider, MD    ALLERGY: Allergies  Allergen Reactions  . Percocet [Oxycodone-Acetaminophen] Other (See Comments)    hallucinations    ROS: ROS  NEUROLOGIC EXAM: Awake, alert, oriented Memory and concentration grossly intact Speech fluent, appropriate CN grossly intact Motor exam: Upper Extremities Deltoid Bicep Tricep Grip  Right 5/5 5/5 5/5 5/5  Left 5/5 5/5 5/5 5/5   Lower Extremity IP Quad PF DF EHL  Right 5/5 5/5 5/5 5/5 5/5  Left 5/5 5/5 5/5 5/5 5/5   Sensation grossly intact to LT  IMPRESSION: - 44 y.o. female 1 yr s/p Pipeline embolization of LICA aneurysm, doing well  PLAN: - Proceed with diagnostic angiogram - Likely home post-procedure  The patient is aware of the indications, risks, benefits, and alternatives to angiogram. All questions were answered.

## 2014-11-01 NOTE — Sedation Documentation (Signed)
Pt at this time has voiced she would like to proceed with out any sedatives.

## 2014-11-01 NOTE — Discharge Instructions (Signed)

## 2014-11-01 NOTE — Op Note (Signed)
DIAGNOSTIC CEREBRAL ANGIOGRAM    OPERATOR:   Dr. Lisbeth Renshaw, MD  HISTORY:   The patient is a 44 y.o. yo female with a history of left internal carotid artery aneurysm which was treated with a pipeline embolization device proximally 1 year ago. She's done very well postoperatively, and presents today for routine follow-up diagnostic cerebral angiogram. She continues to take aspirin 325 mg daily, and stop the Plavix 6 months ago at the initial follow-up angiogram.  APPROACH:   The technical aspects of the procedure as well as its potential risks and benefits were reviewed with the patient. These risks included but were not limited bleeding, infection, allergic reaction, damage to organs/vital structures, stroke, non-diagnostic procedure, and the catastrophic outcomes of heart attack, coma, and death. With an understanding of these risks, informed consent was obtained and witnessed.    The patient was placed in the supine position on the angiography table and the skin of right groin prepped in the usual sterile fashion. The procedure was performed under local anesthesia (1%-solution of bicarbonate-bufferred Lidoacaine) and conscious sedation with Versed and fentanyl monitored by the in-suite nurse.    A 5- French sheath was introduced in the right common femoral artery using Seldinger technique.  A fluorophase sequence was used to document the sheath position.    HEPARIN: 2000 Units total.   CONTRAST AGENT: 50cc, Omnipaque 300   FLUOROSCOPY TIME: 3.4 combined AP and lateral minutes    CATHETER(S) AND WIRE(S):    5-French JB-1 glidecatheter   0.035" glidewire    VESSELS CATHETERIZED:   Right internal carotid   Left internal carotid   Right vertebral   Left vertebral   Right common femoral  VESSELS STUDIED:   Right internal carotid Right vertebral Left internal carotid Left vertebral Right femoral  PROCEDURAL NARRATIVE:   A 5-Fr JB-1 terumo glide catheter was advanced over a  0.035 glidewire into the aortic arch. The above vessels were then sequentially catheterized and cervical/cerebral angiograms taken. After review of images, the catheter was removed without incident.    INTERPRETATION:   Right internal carotid: head:   Injection reveals the presence of a widely patent ICA, M1, and A1 segments and their branches. There is no significant stenosis, occlusion, aneurysm or high flow vascular malformation visualized.  The parenchymal and venous phases are normal. The venous sinuses are widely patent.    Left internal carotid: head:   Injection reveals the presence of a widely patent ICA, A1, and M1 segments and their branches. The previous replaced pipeline device is seen in the region of the distal cavernous and supraclinoid internal carotid artery. The previous he describe aneurysm is no longer filling, and appears completely occluded. There is no in-stent stenosis. The parenchymal and venous phases are normal. The venous sinuses are widely patent.    Right vertebral:   Injection reveals the presence of a widely patent vertebral artery. This leads to a widely patent basilar artery that terminates in bilateral P1. The basilar apex is normal. There is no significant stenosis, occlusion, aneurysm, or vascular malformation visualized. The parenchymal and venous phases are normal. The venous sinuses are widely patent.    Left vertebral:    Normal vessel. No PICA aneurysm. See basilar description above.    Right femoral:    Normal vessel. No significant atherosclerotic disease. Arterial sheath in adequate position.   DISPOSITION:  Upon completion of the study, the femoral sheath was removed and hemostasis obtained using a 5-Fr ExoSeal closure device. Good proximal  and distal lower extremity pulses were documented upon achievement of hemostasis.    The procedure was well tolerated and no early complications were observed.       The patient was transferred back to the  holding area to be positioned flat in bed for 3 hours of observation.    IMPRESSION:  1. 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 There is no in-stent stenosis.  The preliminary results of this procedure were shared with the patient and the patient's family.

## 2015-10-06 IMAGING — XA IR CAROTID INTERNAL HEAD/NECK BILAT  (MS)
10 of 12 series · 12 of 24 positions shown · IV contrast (IODINE)
Comparison: none

PROCEDURE:
DIAGNOSTIC CEREBRAL ANGIOGRAM

OPERATOR:
Dr. Kyuregyan, Gongadze
HISTORY: The patient is a 42-year-old woman who previously underwent
noninvasive imaging which demonstrated an incidental left internal
carotid artery aneurysm. She therefore presents for further workup
with diagnostic cerebral angiogram.

[Series 1: carotid 1 · 2 acquisitions, 1 frame shown (1 of 8)]
[im 1/2]
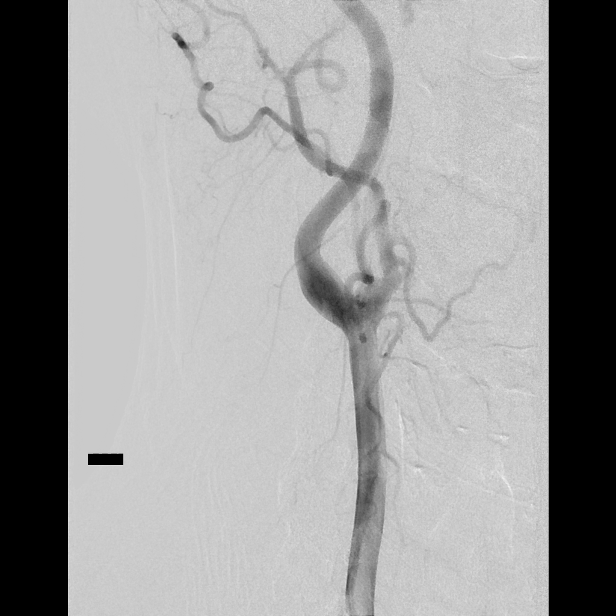

[Series 2: carotid 1 · 2 acquisitions, 1 frame shown (2 of 8)]
[im 1/2]
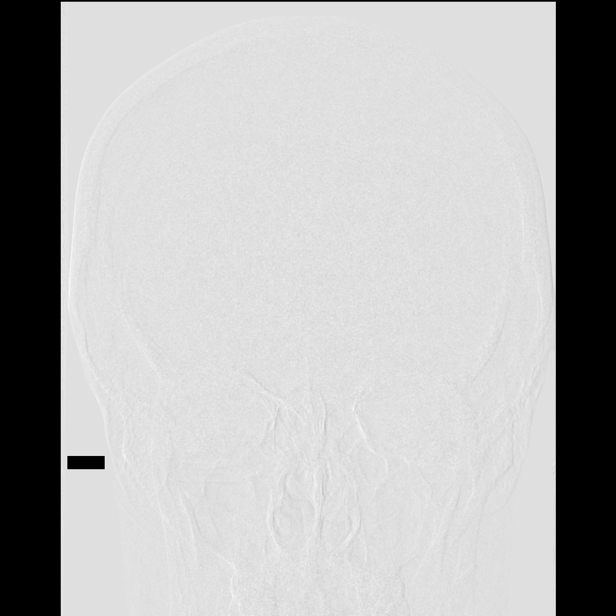

[Series 3: carotid 1 · 2 acquisitions, 1 frame shown (3 of 8)]
[im 1/2]
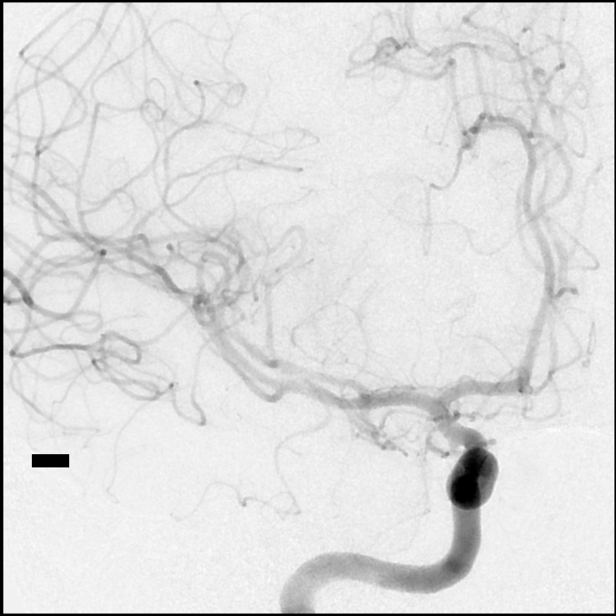

[Series 4: carotid 1 · 2 acquisitions, 1 frame shown (4 of 8)]
[im 1/2]
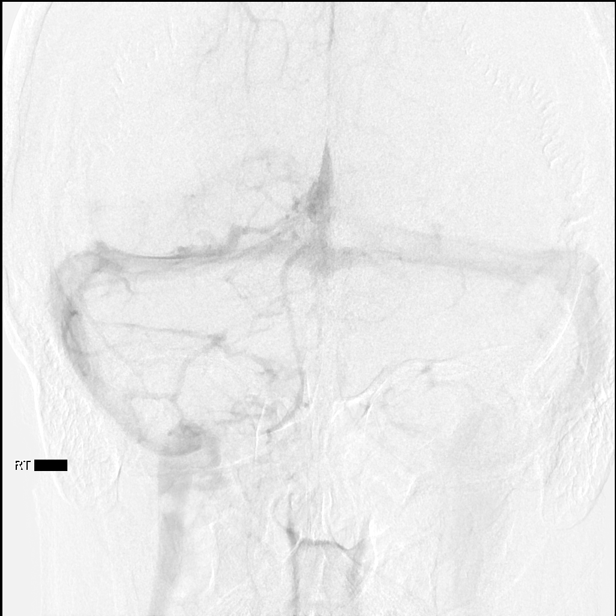

[Series 6: carotid 1 · 2 acquisitions, 1 frame shown (5 of 8)]
[im 1/2  full-range]
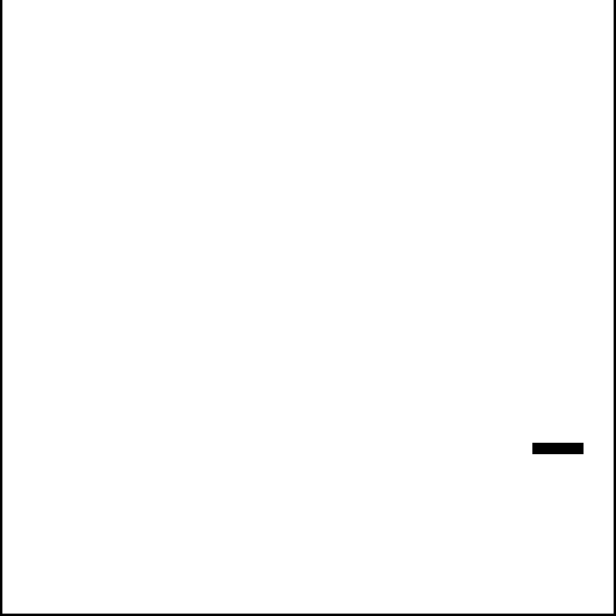

[Series 7: carotid 1 · 2 acquisitions, 1 frame shown (6 of 8)]
[im 1/2]
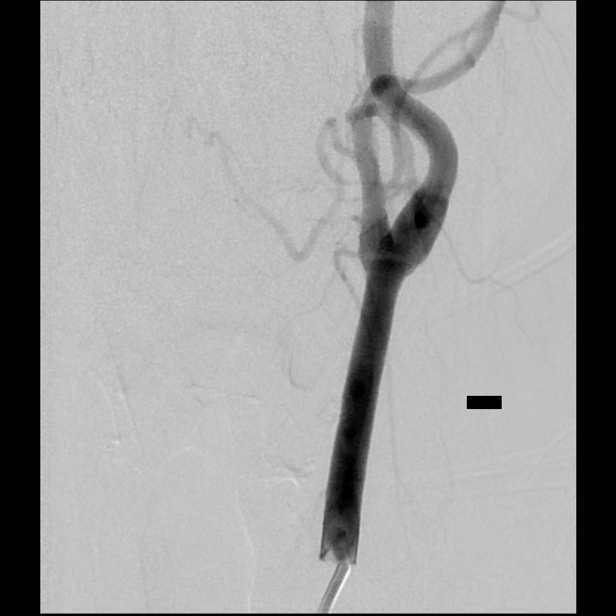

[Series 8: carotid 1 · 2 acquisitions, 1 frame shown (7 of 8)]
[im 1/2]
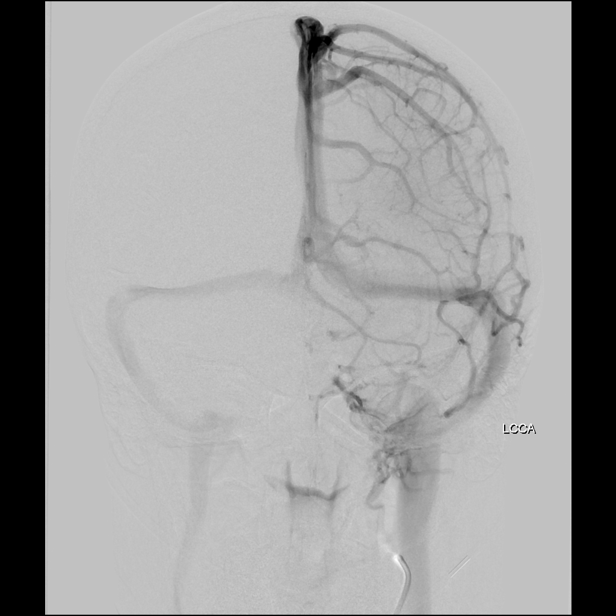

[Series 10: carotid 1 · 2 acquisitions, 1 frame shown (8 of 8)]
[im 1/2]
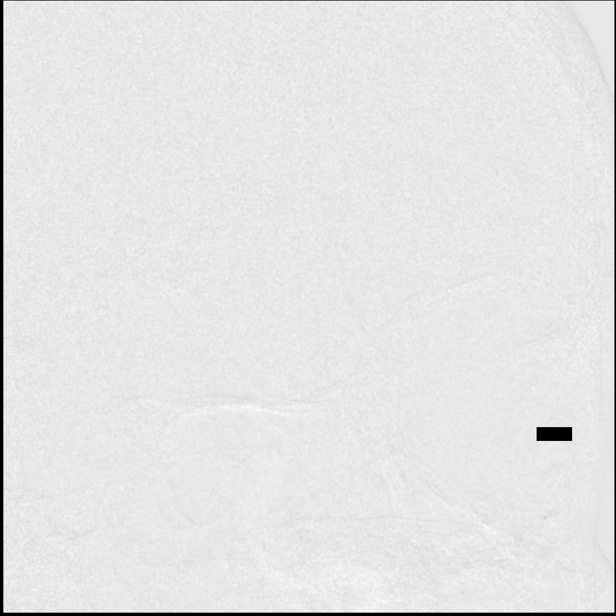

[Series 11: fl neuro · 1 of 13 frames shown]
[frame 2/13]
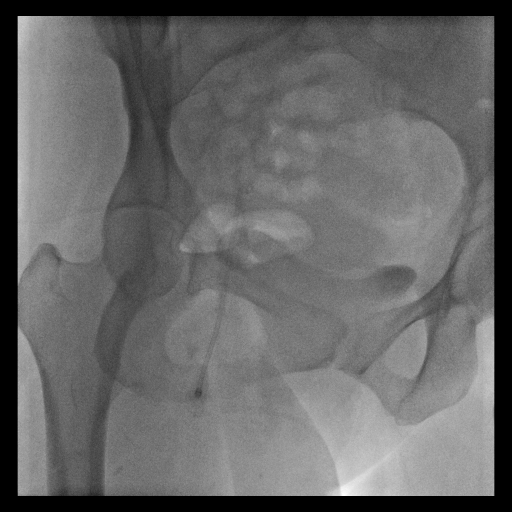

[Series 300: neuro · 3 of 20 slices shown]
[im 2/20]
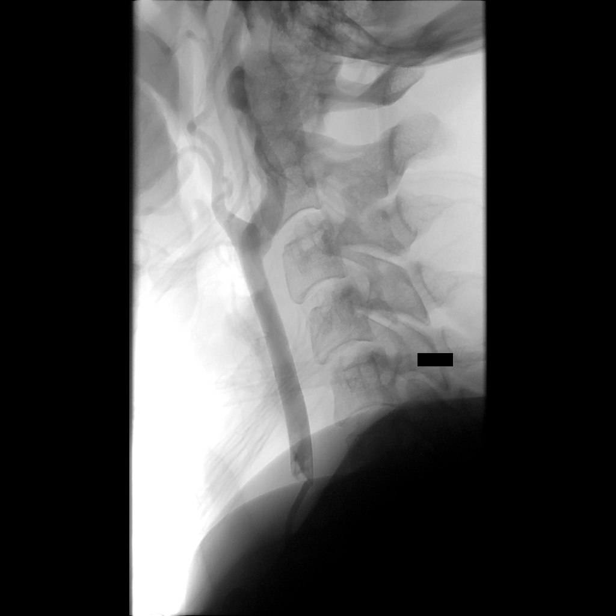
[im 11/20]
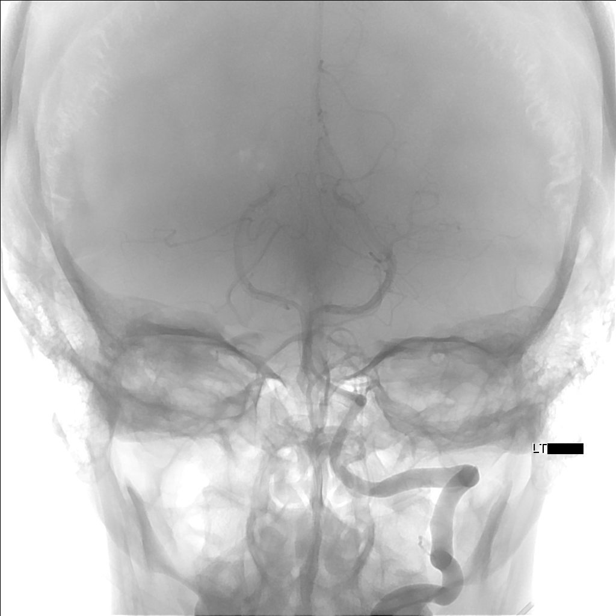
[im 20/20]
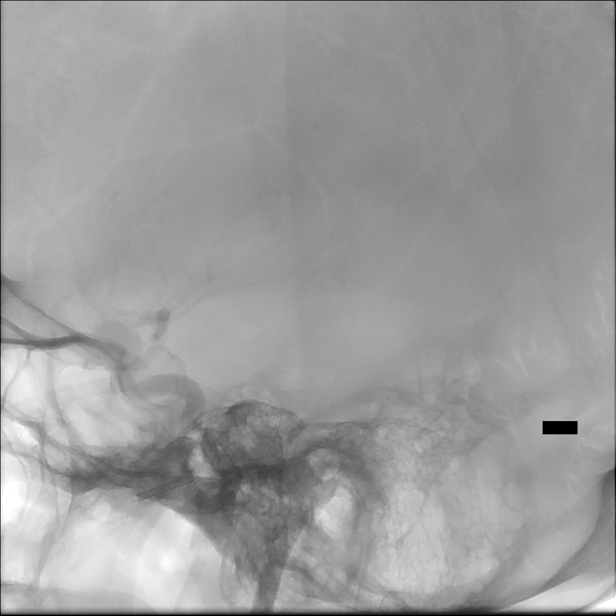

[12 of 24 positions shown; findings below may reference images not displayed]

APPROACH:

The technical aspects of the procedure as well as its potential
risks and benefits were reviewed with the patient. These risks
included but were not limited bleeding, infection, allergic
reaction, damage to organs/vital structures, stroke, non-diagnostic
procedure, and the catastrophic outcomes of heart attack, coma, and
death. With an understanding of these risks, informed consent was
obtained and witnessed.

The patient was placed in the supine position on the angiography
table and the skin of right groin prepped in the usual sterile
fashion. The procedure was performed under local anesthesia
(1%-solution of bicarbonate-buffered Lidocaine) and conscious
sedation with Versed and fentanyl monitored by the in-suite nurse
and myself.

A 5- French sheath was introduced in the right common femoral artery
using Seldinger technique. A fluorophase sequence was used to
document the sheath position.

HEPARIN: 3333 Units total.

CONTRAST AGENT: 80cc, Omnipaque 300

FLUOROSCOPY TIME: 3.3 combined AP and lateral minutes

CATHETER(S) AND WIRE(S):

5-French JB-1 glidecatheter

0.035" glidewire

VESSELS CATHETERIZED:

Right common carotid

Right internal carotid

Left common carotid

Left internal carotid

Right vertebral

Left vertebral

<Right common femoral

>

VESSELS STUDIED:

Right common carotid: neck: AP and lateral

Right internal carotid: head: AP, lateral, obliques

Right vertebral: AP, lateral <and trans-facial>

Left common carotid: neck: AP and lateral

Left internal carotid: head: AP, lateral, obliques

Left vertebral: AP, lateral

<Right femoral: RAO

>

PROCEDURAL NARRATIVE:

A 5-Fr JB-1 terumo glide catheter was then advanced over a
glidewire into the aortic arch and the innominate and right common
carotid artery followed by the right internal carotid artery were
selected. Cervical and cerebral angiography were performed. The JB-1
was then withdrawn into the innominate artery and the right
subclavian artery followed by the right vertebral artery were
selected. Cerebral angiography was performed. The JB-1 was then
withdrawn into the aortic arch and the left common carotid artery
followed by the left internal carotid artery was selected. Cervical
and cerebral angiography were performed. The JB-1 catheter was then
withdrawn into the aortic arch and the left subclavian artery was
selected followed by the left vertebral artery. Cerebral angiography
was performed. The JB-1 catheter was removed without incident.

INTERPRETATION:

Right common carotid: neck:

The carotid bifurcation is at C2-C3. There is no significant
stenosis, occlusion, aneurysm or plaque visualized on this
injection.

Right internal carotid: head:

Injection reveals the presence of a widely patent ICA, M1, and A1
segments and their branches. <There is no significant stenosis,
occlusion, aneurysm or high flow vascular malformation visualized.
>The parenchymal and venous phases are normal. The venous sinuses
are widely patent.

Left common carotid: neck:

The carotid bifurcation is at C2-C3. There is no significant
stenosis, occlusion, aneurysm or plaque visualized on this
injection.

Left internal carotid: head:

Injection reveals the presence of a widely patent ICA, A1, and M1
segments and their branches. There is an aneurysm arising from the
ophthalmic segment of the internal carotid artery, with the
ophthalmic artery at the base. This aneurysm projects superiorly,
and measures approximately 10.6 mm x 6.2 mm with a 3.6 mm neck. The
parenchymal and venous phases are normal. The venous sinuses are
widely patent.

Left vertebral:

Injection reveals the presence of a widely patent vertebral artery.
This leads to a widely patent basilar artery that terminates in
bilateral P1. The basilar apex is normal. There is no significant
stenosis, occlusion, aneurysm, or vascular malformation visualized.
The parenchymal and venous phases are normal. The venous sinuses are
widely patent.

Right vertebral:

Normal vessel. No PICA aneurysm. See basilar description above.

<Right femoral:

Normal vessel. No significant atherosclerotic disease. Arterial
sheath in adequate position.

>DISPOSITION:

Upon completion of the study, the femoral sheath was removed and
hemostasis obtained using a 5-Fr ExoSeal closure device. Good
proximal and distal lower extremity pulses were documented upon
achievement of hemostasis.

The procedure was well tolerated and no early complications were
observed.

The patient was transferred back to the holding area to be
positioned flat in bed for 3 hours of observation.
IMPRESSION: 1. Superiorly projecting, approximately 10 mm left paraophthalmic
internal carotid artery aneurysm as described above.

The preliminary results of this procedure were shared with the
patient and the patient's family.

## 2016-05-23 HISTORY — PX: BREAST IMPLANT REMOVAL: SUR1101

## 2017-04-04 DIAGNOSIS — Z886 Allergy status to analgesic agent status: Secondary | ICD-10-CM | POA: Diagnosis not present

## 2017-04-04 DIAGNOSIS — Z7982 Long term (current) use of aspirin: Secondary | ICD-10-CM | POA: Diagnosis not present

## 2017-04-04 DIAGNOSIS — G35 Multiple sclerosis: Secondary | ICD-10-CM | POA: Diagnosis not present

## 2017-04-04 DIAGNOSIS — Z885 Allergy status to narcotic agent status: Secondary | ICD-10-CM | POA: Diagnosis not present

## 2017-04-04 DIAGNOSIS — Z79899 Other long term (current) drug therapy: Secondary | ICD-10-CM | POA: Diagnosis not present

## 2018-10-16 ENCOUNTER — Other Ambulatory Visit: Payer: Self-pay | Admitting: Obstetrics and Gynecology

## 2018-10-16 DIAGNOSIS — R928 Other abnormal and inconclusive findings on diagnostic imaging of breast: Secondary | ICD-10-CM

## 2018-10-21 ENCOUNTER — Ambulatory Visit
Admission: RE | Admit: 2018-10-21 | Discharge: 2018-10-21 | Disposition: A | Discharge status: Home / Self Care | Payer: BLUE CROSS/BLUE SHIELD | Source: Ambulatory Visit | Attending: Obstetrics and Gynecology | Admitting: Obstetrics and Gynecology

## 2018-10-21 ENCOUNTER — Other Ambulatory Visit: Payer: Self-pay

## 2018-10-21 ENCOUNTER — Ambulatory Visit
Admission: RE | Admit: 2018-10-21 | Discharge: 2018-10-21 | Disposition: A | Discharge status: Home / Self Care | Payer: PRIVATE HEALTH INSURANCE | Source: Ambulatory Visit | Attending: Obstetrics and Gynecology | Admitting: Obstetrics and Gynecology

## 2018-10-21 DIAGNOSIS — R928 Other abnormal and inconclusive findings on diagnostic imaging of breast: Secondary | ICD-10-CM

## 2021-03-09 ENCOUNTER — Other Ambulatory Visit: Payer: Self-pay | Admitting: Internal Medicine

## 2021-03-09 ENCOUNTER — Other Ambulatory Visit (HOSPITAL_COMMUNITY): Payer: Self-pay | Admitting: Internal Medicine

## 2021-03-09 DIAGNOSIS — E0590 Thyrotoxicosis, unspecified without thyrotoxic crisis or storm: Secondary | ICD-10-CM

## 2021-03-13 ENCOUNTER — Encounter (HOSPITAL_COMMUNITY): Payer: No Typology Code available for payment source

## 2021-03-14 ENCOUNTER — Encounter (HOSPITAL_COMMUNITY): Payer: No Typology Code available for payment source

## 2021-03-14 ENCOUNTER — Encounter (HOSPITAL_COMMUNITY): Payer: Self-pay

## 2021-03-22 DIAGNOSIS — E0500 Thyrotoxicosis with diffuse goiter without thyrotoxic crisis or storm: Secondary | ICD-10-CM

## 2021-03-22 HISTORY — DX: Thyrotoxicosis with diffuse goiter without thyrotoxic crisis or storm: E05.00

## 2021-03-26 ENCOUNTER — Encounter (HOSPITAL_COMMUNITY)
Admission: RE | Admit: 2021-03-26 | Discharge: 2021-03-26 | Disposition: A | Discharge status: Home / Self Care | Payer: No Typology Code available for payment source | Source: Ambulatory Visit | Attending: Internal Medicine | Admitting: Internal Medicine

## 2021-03-26 ENCOUNTER — Other Ambulatory Visit: Payer: Self-pay

## 2021-03-26 DIAGNOSIS — E0590 Thyrotoxicosis, unspecified without thyrotoxic crisis or storm: Secondary | ICD-10-CM | POA: Insufficient documentation

## 2021-03-26 MED ORDER — SODIUM IODIDE I-123 7.4 MBQ CAPS
440.0000 | ORAL_CAPSULE | Freq: Once | ORAL | Status: AC
  Administered 2021-03-26: 440 via ORAL

## 2021-03-27 ENCOUNTER — Encounter (HOSPITAL_COMMUNITY)
Admission: RE | Admit: 2021-03-27 | Discharge: 2021-03-27 | Disposition: A | Discharge status: Home / Self Care | Payer: No Typology Code available for payment source | Source: Ambulatory Visit | Attending: Internal Medicine | Admitting: Internal Medicine

## 2021-04-25 DIAGNOSIS — Z8616 Personal history of COVID-19: Secondary | ICD-10-CM

## 2021-04-25 HISTORY — DX: Personal history of COVID-19: Z86.16

## 2021-06-15 ENCOUNTER — Telehealth: Payer: Self-pay | Admitting: *Deleted

## 2021-06-15 NOTE — Telephone Encounter (Signed)
Spoke with the patient to schedule a new patient appt with Dr Pricilla Holm on 3/10 at 9:45 am. Patient given the address and phone of the clinic, along with the policy for mask and visitors

## 2021-06-25 ENCOUNTER — Other Ambulatory Visit: Payer: Self-pay | Admitting: Internal Medicine

## 2021-06-26 ENCOUNTER — Other Ambulatory Visit: Payer: Self-pay | Admitting: Internal Medicine

## 2021-06-26 DIAGNOSIS — E0500 Thyrotoxicosis with diffuse goiter without thyrotoxic crisis or storm: Secondary | ICD-10-CM

## 2021-06-27 ENCOUNTER — Encounter: Payer: Self-pay | Admitting: Gynecologic Oncology

## 2021-06-28 NOTE — Progress Notes (Signed)
GYNECOLOGIC ONCOLOGY NEW PATIENT CONSULTATION   Patient Name: Kristin Stewart  Patient Age: 51 y.o. Date of Service: 06/26/2021 Referring Provider: Dr. 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  We discussed that vulvar dysplasia and cancer arises from 2 different pathways, one of which is HPV associated as I presume hers is.  Although she is had a recent history of normal Pap smears, she has a history of cervical dysplasia from her 47s.  The patient has 3 distinct lesions noted on exam, all 1 cm or less in size.  I reviewed treatment options in the setting of high-grade vulvar dysplasia including surgical excision or CO2 laser ablation.  When dysplasia is multifocal and there is concern about disfigurement or significant alteration of anatomy with excision, then sometimes we proceed with laser ablation or a combination of the 2 treatment options.  In her case, I think it would be feasible to excise all 3 lesions with a good negative margin and without causing significant alterations to her vulvar anatomy.  I discussed that complete excision helps to rule out malignancy.  If, at the time of surgery, I think that 1 or more of the lesions would be more amenable to laser ablation, then I discussed plan for biopsies of this lesion or lesions prior to laser ablation.  We discussed the plan for wide local excision of the vulva, possible CO2 laser ablation, and any other indicated procedures.  Risk for this discussed with the patient including but not limited to bleeding, need for blood transfusion, infection, damage to surrounding structures, need for additional procedures, risk of tissue separation during healing, risk associated with anesthesia.  Perioperative instructions were reviewed with the  patient today.  She will receive DVT prophylaxis and antibiotic prophylaxis as indicated.  Surgery was scheduled for 3/14.  Medications for her postoperative recovery were sent to her pharmacy today.  I discussed that we will do testing on her pathology from her upcoming surgery to help delineate whether this is in fact HPV-related disease.  I also reviewed that after surgical treatment of high-grade dysplasia, there is a risk of disease recurrence which requires long-term surveillance.  This surveillance includes visits every 6 months for 5 years and then yearly visits.  I will have the office reach out to her neurologist for clearance and any recommendations.  A copy of this note was sent to the patient's referring provider.   55 minutes of total time was spent for this patient encounter, including preparation, face-to-face counseling with the patient and coordination of care, and documentation of the encounter.  Eugene Garnet, MD  Division of Gynecologic Oncology  Department of Obstetrics and Gynecology  [facility]  ___________________________________________  Chief Complaint: Chief Complaint  Patient presents with   VIN III (vulvar intraepithelial neoplasia III)    History of Present Illness:  Kristin Stewart is a 51 y.o. y.o. female who is seen in consultation at the request of Dr. Henderson Cloud for an evaluation of high-grade vulvar dysplasia.  Patient was initially seen for a well woman's exam on 05/09/2021.  She was asymptomatic but several raised flat plaques were seen on her perineum.  She was seen again on 2/10 with biopsy performed of one of the right vulvar lesions.  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  She reports overall doing well.  She denies any vulvar pain, pruritus, discharge or bleeding.  Has not felt any lumps or masses.  She endorses a good appetite without nausea or emesis.  She reports  baseline bowel and bladder function.  Her medical history is notable for Graves' that was recently diagnosed in November.  She also has a history of a cerebral aneurysm that was stented in 2015.  She follows with neurology and neurosurgery for this.  This was diagnosed during imaging in the setting of her multiple sclerosis.  She is currently asymptomatic with regard to her multiple sclerosis.  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  PAST MEDICAL HISTORY:  Past Medical History:  Diagnosis Date   Anxiety    Cerebral aneurysm without rupture 07/2013   followed by neurology--   08-12-2013 s/p pipline embolization device to left ICA (cavernous segment)   (last brian MRI/ MRA in care everywhere 07-29-2020  no residual and no recurrence   Cervicalgia    Chronic headaches    Graves disease 03/2021   06-29-2021  per pt followed by pcp,  s/p RAI 03-26-2021, stated lab work is normal   History of abnormal cervical Pap smear    1990s   History of COVID-19 04/25/2021   per pt mild symptoms that resovled   MS (multiple sclerosis) [facility])    neurologist--- dr Bea Laura. pharr  ([facility]);  dx 2000   Palpitations    06-29-2021  per pt due to dx graves disease/ hyperthyroidism,  stated takes propranolol if needed, has not needed since RAI 12/ 2022   PONV (postoperative nausea and vomiting)    Vertigo    Vulvar dysplasia    VIN III     PAST SURGICAL HISTORY:  Past Surgical History:  Procedure Laterality Date   BREAST ENHANCEMENT SURGERY Bilateral 2008   BREAST IMPLANT REMOVAL Bilateral 05/2016   With lift   LAPAROSCOPY WITH TUBAL LIGATION  06/08/2007   @[facility]  by dr Billy Coast;   W/  Cauterization and repair uterine perferation;   after attempted  hysterscopic Essure tubal complcation by uterine perferation   RADIOLOGY WITH ANESTHESIA N/A 08/12/2013   Procedure: EMBOLIZATION;  Surgeon: Lisbeth Renshaw, MD;  Location: [facility] OR;  Service: Radiology;  Laterality: N/A;   TONSILLECTOMY AND ADENOIDECTOMY  1988    OB/GYN HISTORY:  OB History  Gravida Para Term Preterm AB Living  0 0 0 0 0 0  SAB IAB Ectopic Multiple Live Births  0 0 0 0 0    Patient's last menstrual period was 06/10/2021 (exact date).  Age at menarche: 45  Age at menopause: Not applicable Hx of HRT: Not applicable Hx of STDs: Denies although with her Pap smear history, suspect she has HPV Last pap: 2019 normal, HR HPV negative History of abnormal pap smears: yes, remote history of abnormal pap in 90s, normal since.  Did not require any procedures at that time. Menstrual history: Patient describes her menses currently as irregular occurring every 21 to 28 days.  She skipped a couple of periods within the last calendar year.  This month she had a small amount of intermenstrual bleeding.  This was also the case on several other occasions recently which she has attributed to her Graves' disease. Patient was on oral birth control pills for approximately 20 years.  She has  a tubal ligation for birth control.  SCREENING STUDIES:  Last mammogram: 04/2021  Last colonoscopy: 2018  MEDICATIONS: Outpatient Encounter Medications as of 06/29/2021  Medication Sig   ASPIRIN 81 PO Take 81 mg by mouth daily.   Cholecalciferol 50 MCG (2000 UT) CAPS Take 4,000 Units by mouth daily.   clonazePAM (KLONOPIN) 1 MG tablet Take 1 mg by mouth 2 (two) times daily as needed.   Dimethyl Fumarate (TECFIDERA) 120 & 240 MG MISC Take 1 capsule by mouth 2 (two) times daily. (Patient taking differently: Take 1 capsule by mouth 2 (two) times daily.)   diphenhydrAMINE (BENADRYL) 25 MG tablet Take 25 mg by mouth 2 (two) times daily as needed (flushing). Takes with tecfidera   ibuprofen (ADVIL,MOTRIN) 200 MG tablet Take 600 mg by mouth every 6 (six) hours as needed for headache or moderate pain.   losartan (COZAAR) 100 MG tablet Take 1 tablet by mouth  daily.   methimazole (TAPAZOLE) 10 MG tablet Take 10 mg by mouth at bedtime.   propranolol (INDERAL) 10 MG tablet Take 10 mg by mouth daily as needed.   senna-docusate (SENOKOT-S) 8.6-50 MG tablet Take 2 tablets by mouth at bedtime. For AFTER surgery, do not take if having diarrhea (Patient not taking: Reported on 06/29/2021)   venlafaxine XR (EFFEXOR-XR) 75 MG 24 hr capsule Take 75 mg by mouth daily with breakfast.   [DISCONTINUED] HYDROcodone-acetaminophen (NORCO/VICODIN) 5-325 MG tablet Take 1 tablet by mouth every 4 (four) hours as needed for severe pain. For AFTER surgery only, do not take and drive   HYDROcodone-acetaminophen (NORCO/VICODIN) 5-325 MG tablet Take 1 tablet by mouth every 4 (four) hours as needed for severe pain. For AFTER surgery only, do not take and drive (Patient not taking: Reported on 06/29/2021)   [DISCONTINUED] aspirin 325 MG EC tablet Take 325 mg by mouth daily.   [DISCONTINUED] bismuth subsalicylate (PEPTO BISMOL) 262 MG chewable tablet Chew 524 mg by mouth 2 (two) times daily as needed for diarrhea or loose stools.   [DISCONTINUED] clonazePAM (KLONOPIN) 0.5 MG tablet Take 0.5 mg by mouth at bedtime as needed for anxiety.    [DISCONTINUED] Multiple Vitamins-Minerals (MULTIVITAMIN PO) Take 1 tablet by mouth daily.    [DISCONTINUED] topiramate (TOPAMAX) 50 MG tablet Take 100 mg by mouth at bedtime.   No facility-administered encounter medications on file as of 06/29/2021.    ALLERGIES:  Allergies  Allergen Reactions   Oxycodone Other (See Comments)    hallucinations     FAMILY HISTORY:  Family History  Problem Relation Age of Onset   Anxiety disorder Mother    Depression Mother    Diabetes type II Maternal Grandmother    Colon cancer Paternal Grandmother    Breast cancer Neg Hx    Ovarian cancer Neg Hx    Endometrial cancer Neg Hx    Pancreatic cancer Neg Hx    Prostate cancer Neg Hx      SOCIAL HISTORY:  Social Connections: Not on file    REVIEW OF  SYSTEMS:  Denies appetite changes, fevers, chills, fatigue, unexplained weight changes. Denies hearing loss, neck lumps or masses, mouth sores, ringing in ears or voice changes. Denies cough or wheezing.  Denies shortness of breath. Denies chest pain or palpitations. Denies leg swelling. Denies abdominal distention, pain, blood in stools, constipation, diarrhea, nausea, vomiting, or early satiety. Denies pain with intercourse, dysuria, frequency, hematuria or incontinence. Denies hot flashes, pelvic pain, vaginal bleeding or vaginal discharge.   Denies joint pain, back  pain or muscle pain/cramps. Denies itching, rash, or wounds. Denies dizziness, headaches, numbness or seizures. Denies swollen lymph nodes or glands, denies easy bruising or bleeding. Denies anxiety, depression, confusion, or decreased concentration.  Physical Exam:  Vital Signs for this encounter:  Blood pressure 126/71, pulse 90, temperature 98.6 F (37 C), temperature source Oral, resp. rate 16, height 5' 4.96" (1.65 m), weight 158 lb (71.7 kg), last menstrual period 06/10/2021, SpO2 98 %. Body mass index is 26.32 kg/m. General: Alert, oriented, no acute distress.  HEENT: Normocephalic, atraumatic. Sclera anicteric.  Chest: Clear to auscultation bilaterally. No wheezes, rhonchi, or rales. Cardiovascular: Regular rate and rhythm, no murmurs, rubs, or gallops.  Abdomen: Normoactive bowel sounds. Soft, nondistended, nontender to palpation. No masses or hepatosplenomegaly appreciated. No palpable fluid wave.  Extremities: Grossly normal range of motion. Warm, well perfused. No edema bilaterally.  Skin: No rashes or lesions.  Lymphatics: No cervical, supraclavicular, or inguinal adenopathy.  GU:  External female genitalia notable for 3 visible areas of local plicae a, 2 on the right and 1 on the left.  Acetic acid placed on the vulva with findings of acetowhite in 3 distinct locations.  There is an approximately 1 cm lesion  along the posterior vulva at 5:00, healing biopsy site along the right vulva at 7:00 has some acetowhite changes around the healing biopsy, and there is an approximately 5 mm area of acetowhite along the right outer labia at 9:00.             Bladder/urethra:  No lesions or masses, well supported bladder             Vagina: Well rugated, no lesions or masses noted.             Cervix: Normal appearing, no lesions.  Posterior facing.             Uterus:  Small, mobile, no parametrial involvement or nodularity.             Adnexa: No masses appreciated.  Rectal: Deferred.  LABORATORY AND RADIOLOGIC DATA:  Outside medical records were reviewed to synthesize the above history, along with the history and physical obtained during the visit.   Lab Results  Component Value Date   WBC 5.2 11/01/2014   HGB 13.1 11/01/2014   HCT 37.3 11/01/2014   PLT 273 11/01/2014   GLUCOSE 87 11/01/2014   ALT 23 05/20/2013   AST 27 05/20/2013   NA 137 11/01/2014   K 4.5 11/01/2014   CL 106 11/01/2014   CREATININE 0.66 11/01/2014   BUN 13 11/01/2014   CO2 21 (L) 11/01/2014   TSH 2.380 05/20/2013   INR 1.03 11/01/2014

## 2021-06-28 NOTE — H&P (View-Only) (Signed)
GYNECOLOGIC ONCOLOGY NEW PATIENT CONSULTATION  ? ?Patient Name: Kristin Stewart  ?Patient Age: 51 y.o. ?Date of Service: 06/26/2021 ?Referring Provider: Dr. Carrington Clamp ? ?Primary Care Provider: Garlan Fillers, MD ?Consulting Provider: Eugene Garnet, MD  ? 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 ? 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  We discussed that vulvar dysplasia and cancer arises from 2 different pathways, one of which is HPV associated as I presume hers is.  Although she is had a recent history of normal Pap smears, she has a history of cervical dysplasia from her 65s. ? ?The patient has 3 distinct lesions noted on exam, all 1 cm or less in size.  I reviewed treatment options in the setting of high-grade vulvar dysplasia including surgical excision or CO2 laser ablation.  When dysplasia is multifocal and there is concern about disfigurement or significant alteration of anatomy with excision, then sometimes we proceed with laser ablation or a combination of the 2 treatment options.  In her case, I think it would be feasible to excise all 3 lesions with a good negative margin and without causing significant alterations to her vulvar anatomy.  I discussed that complete excision helps to rule out malignancy.  If, at the time of surgery, I think that 1 or more of the lesions would be more amenable to laser ablation, then I discussed plan for biopsies of this lesion or lesions prior to laser ablation. ? ?We discussed the plan for wide local excision of the vulva, possible CO2 laser ablation, and any other indicated procedures.  Risk for this discussed with the patient including but not limited to bleeding, need for blood transfusion, infection, damage to surrounding structures, need for additional procedures, risk of tissue separation during healing, risk associated with anesthesia.  Perioperative instructions were reviewed with the  patient today.  She will receive DVT prophylaxis and antibiotic prophylaxis as indicated.  Surgery was scheduled for 3/14.  Medications for her postoperative recovery were sent to her pharmacy today. ? ?I discussed that we will do testing on her pathology from her upcoming surgery to help delineate whether this is in fact HPV-related disease.  I also reviewed that after surgical treatment of high-grade dysplasia, there is a risk of disease recurrence which requires long-term surveillance.  This surveillance includes visits every 6 months for 5 years and then yearly visits. ? ?I will have the office reach out to her neurologist for clearance and any recommendations. ? ?A copy of this note was sent to the patient's referring provider.  ? ?55 minutes of total time was spent for this patient encounter, including preparation, face-to-face counseling with the patient and coordination of care, and documentation of the encounter. ? ?Eugene Garnet, MD  ?Division of Gynecologic Oncology  ?Department of Obstetrics and Gynecology  ?[facility]  ?___________________________________________  ?Chief Complaint: ?Chief Complaint  ?Patient presents with  ? VIN III (vulvar intraepithelial neoplasia III)  ? ? ?History of Present Illness:  ?Kristin Stewart is a 51 y.o. y.o. female who is seen in consultation at the request of Dr. Henderson Cloud for an evaluation of high-grade vulvar dysplasia. ? ?Patient was initially seen for a well woman's exam on 05/09/2021.  She was asymptomatic but several raised flat plaques were seen on her perineum.  She was seen again on 2/10 with biopsy performed of one of the right vulvar lesions.  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 ? ?  She reports overall doing well.  She denies any vulvar pain, pruritus, discharge or bleeding.  Has not felt any lumps or masses. ? ?She endorses a good appetite without nausea or emesis.  She reports  baseline bowel and bladder function. ? ?Her medical history is notable for Graves' that was recently diagnosed in November.  She also has a history of a cerebral aneurysm that was stented in 2015.  She follows with neurology and neurosurgery for this.  This was diagnosed during imaging in the setting of her multiple sclerosis.  She is currently asymptomatic with regard to her multiple sclerosis. ? 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 ? ?PAST MEDICAL HISTORY:  ?Past Medical History:  ?Diagnosis Date  ? Anxiety   ? Cerebral aneurysm without rupture 07/2013  ? followed by neurology--   08-12-2013 s/p pipline embolization device to left ICA (cavernous segment)   (last brian MRI/ MRA in care everywhere 07-29-2020  no residual and no recurrence  ? Cervicalgia   ? Chronic headaches   ? Graves disease 03/2021  ? 06-29-2021  per pt followed by pcp,  s/p RAI 03-26-2021, stated lab work is normal  ? History of abnormal cervical Pap smear   ? 1990s  ? History of COVID-19 04/25/2021  ? per pt mild symptoms that resovled  ? MS (multiple sclerosis) ([facility])   ? neurologist--- dr Bea Laura. pharr  ([facility]);  dx 2000  ? Palpitations   ? 06-29-2021  per pt due to dx graves disease/ hyperthyroidism,  stated takes propranolol if needed, has not needed since RAI 12/ 2022  ? PONV (postoperative nausea and vomiting)   ? Vertigo   ? 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  ? ?PAST SURGICAL HISTORY:  ?Past Surgical History:  ?Procedure Laterality Date  ? BREAST ENHANCEMENT SURGERY Bilateral 2008  ? BREAST IMPLANT REMOVAL Bilateral 05/2016  ? With lift  ? LAPAROSCOPY WITH TUBAL LIGATION  06/08/2007  ? @[facility]  by dr ;   W/  Cauterization and repair uterine perferation;   after attempted  hysterscopic Essure tubal complcation by uterine perferation  ? RADIOLOGY WITH ANESTHESIA N/A 08/12/2013  ? Procedure: EMBOLIZATION;  Surgeon: 08/14/2013, MD;  Location: [facility] OR;  Service: Radiology;  Laterality: N/A;  ? TONSILLECTOMY AND ADENOIDECTOMY  1988  ? ? ?OB/GYN HISTORY:  ?OB History  ?Gravida Para Term Preterm AB Living  ?0 0 0 0 0 0  ?SAB IAB Ectopic Multiple Live Births  ?0 0 0 0 0  ? ? ?Patient's last menstrual period was 06/10/2021 (exact date). ? ?Age at menarche: 58  ?Age at menopause: Not applicable ?Hx of HRT: Not applicable ?Hx of STDs: Denies although with her Pap smear history, suspect she has HPV ?Last pap: 2019 normal, HR HPV negative ?History of abnormal pap smears: yes, remote history of abnormal pap in 90s, normal since.  Did not require any procedures at that time. ?Menstrual history: Patient describes her menses currently as irregular occurring every 21 to 28 days.  She skipped a couple of periods within the last calendar year.  This month she had a small amount of intermenstrual bleeding.  This was also the case on several other occasions recently which she has attributed to her Graves' disease. ?Patient was on oral birth control pills for approximately 20 years.  She has  a tubal ligation for birth control. ? ?SCREENING STUDIES:  ?Last mammogram: 04/2021  ?Last colonoscopy: 2018 ? ?MEDICATIONS: ?Outpatient Encounter Medications as of 06/29/2021  ?Medication Sig  ? ASPIRIN 81 PO Take 81 mg by mouth daily.  ? Cholecalciferol 50 MCG (2000 UT) CAPS Take 4,000 Units by mouth daily.  ? clonazePAM (KLONOPIN) 1 MG tablet Take 1 mg by mouth 2 (two) times daily as needed.  ? Dimethyl Fumarate (TECFIDERA) 120 & 240 MG MISC Take 1 capsule by mouth 2 (two) times daily. (Patient taking differently: Take 1 capsule by mouth 2 (two) times daily.)  ? diphenhydrAMINE (BENADRYL) 25 MG tablet Take 25 mg by mouth 2 (two) times daily as needed (flushing). Takes with tecfidera  ? ibuprofen (ADVIL,MOTRIN) 200 MG tablet Take 600 mg by mouth every 6 (six) hours as needed for headache or moderate pain.  ? losartan (COZAAR) 100 MG tablet Take 1 tablet by mouth  daily.  ? methimazole (TAPAZOLE) 10 MG tablet Take 10 mg by mouth at bedtime.  ? propranolol (INDERAL) 10 MG tablet Take 10 mg by mouth daily as needed.  ? senna-docusate (SENOKOT-S) 8.6-50 MG tablet Take

## 2021-06-29 ENCOUNTER — Encounter (HOSPITAL_BASED_OUTPATIENT_CLINIC_OR_DEPARTMENT_OTHER): Payer: Self-pay | Admitting: Gynecologic Oncology

## 2021-06-29 ENCOUNTER — Telehealth: Payer: Self-pay | Admitting: *Deleted

## 2021-06-29 ENCOUNTER — Inpatient Hospital Stay (HOSPITAL_BASED_OUTPATIENT_CLINIC_OR_DEPARTMENT_OTHER): Payer: PRIVATE HEALTH INSURANCE | Admitting: Gynecologic Oncology

## 2021-06-29 ENCOUNTER — Other Ambulatory Visit: Payer: Self-pay

## 2021-06-29 ENCOUNTER — Inpatient Hospital Stay: Payer: PRIVATE HEALTH INSURANCE | Attending: Gynecologic Oncology | Admitting: Gynecologic Oncology

## 2021-06-29 ENCOUNTER — Encounter: Payer: Self-pay | Admitting: Gynecologic Oncology

## 2021-06-29 ENCOUNTER — Other Ambulatory Visit: Payer: Self-pay | Admitting: Gynecologic Oncology

## 2021-06-29 VITALS — BP 126/71 | HR 90 | Temp 98.6°F | Resp 16 | Ht 64.96 in | Wt 158.0 lb

## 2021-06-29 DIAGNOSIS — I671 Cerebral aneurysm, nonruptured: Secondary | ICD-10-CM | POA: Diagnosis not present

## 2021-06-29 DIAGNOSIS — Z8041 Family history of malignant neoplasm of ovary: Secondary | ICD-10-CM | POA: Diagnosis not present

## 2021-06-29 DIAGNOSIS — Z800 Family history of malignant neoplasm of digestive organs: Secondary | ICD-10-CM | POA: Diagnosis not present

## 2021-06-29 DIAGNOSIS — Z8042 Family history of malignant neoplasm of prostate: Secondary | ICD-10-CM | POA: Insufficient documentation

## 2021-06-29 DIAGNOSIS — F419 Anxiety disorder, unspecified: Secondary | ICD-10-CM | POA: Insufficient documentation

## 2021-06-29 DIAGNOSIS — D071 Carcinoma in situ of vulva: Secondary | ICD-10-CM

## 2021-06-29 DIAGNOSIS — Z803 Family history of malignant neoplasm of breast: Secondary | ICD-10-CM | POA: Insufficient documentation

## 2021-06-29 DIAGNOSIS — G35 Multiple sclerosis: Secondary | ICD-10-CM | POA: Insufficient documentation

## 2021-06-29 DIAGNOSIS — Z79899 Other long term (current) drug therapy: Secondary | ICD-10-CM | POA: Diagnosis not present

## 2021-06-29 DIAGNOSIS — Z8616 Personal history of COVID-19: Secondary | ICD-10-CM | POA: Diagnosis not present

## 2021-06-29 DIAGNOSIS — Z7982 Long term (current) use of aspirin: Secondary | ICD-10-CM | POA: Diagnosis not present

## 2021-06-29 DIAGNOSIS — E0500 Thyrotoxicosis with diffuse goiter without thyrotoxic crisis or storm: Secondary | ICD-10-CM | POA: Insufficient documentation

## 2021-06-29 MED ORDER — SENNOSIDES-DOCUSATE SODIUM 8.6-50 MG PO TABS: 2.0000 | ORAL_TABLET | Freq: Every day | ORAL | 0 refills | Status: DC

## 2021-06-29 MED ORDER — HYDROCODONE-ACETAMINOPHEN 5-325 MG PO TABS: 1.0000 | ORAL_TABLET | ORAL | 0 refills | Status: DC | PRN

## 2021-06-29 NOTE — Progress Notes (Signed)
Spoke w/ via phone for pre-op interview--- pt ?Lab needs dos----  urine preg (per anes)/  pre-op orders pending             ?Lab results------ no ?COVID test -----  states asymptomatic no test needed ?Arrive at ------- 0845 on 07-03-2021 ?NPO after MN NO Solid Food.  Clear liquids from MN until--- 0945 ?Med rec completed ?Medications to take morning of surgery ----- tecfidera, tapazole, effexor ?Diabetic medication ----- n/a ?Patient instructed no nail polish to be worn day of surgery ?Patient instructed to bring photo id and insurance card day of surgery ?Patient aware to have Driver (ride ) / caregiver for 24 hours after surgery ---  pt just posted today, stated is working on whom will be driver/ caregiver ?Patient Special Instructions ----- pt verbalized understanding to have name and phone number of driver/ caregiver at check-in and must age 45 older ?Pre-Op special Istructions ----- case just posted today, pending pre-op orders ?Patient verbalized understanding of instructions that were given at this phone interview. ?Patient denies shortness of breath, chest pain, fever, cough at this phone interview.  ?

## 2021-06-29 NOTE — Telephone Encounter (Signed)
Per Dr Pricilla Holm sent note to Dr Harlen Labs (patient's neurologist) about her upcoming procedure  ?

## 2021-06-29 NOTE — Patient Instructions (Signed)
Preparing for your Surgery ? ?Plan for surgery on July 03, 2021 with Dr. Jeral Pinch at [facility]. You will be scheduled for wide local excisions of the vulva, possible vulvar biopsies, possible vulvar laser.  ? ?Pre-operative Testing ?-You will receive a phone call from presurgical testing at [facility] to discuss surgery instructions and arrange for lab work if needed. ? ?-Bring your insurance card, copy of an advanced directive if applicable, medication list. ? ?-You should not be taking blood thinners or aspirin at least ten days prior to surgery unless instructed by your surgeon. ? ?-Do not take supplements such as fish oil (omega 3), red yeast rice, turmeric before your surgery. You want to avoid medications with aspirin in them including headache powders such as BC or Goody's), Excedrin migraine. ? ?Day Before Surgery at Home ?-You will be advised you can have clear liquids up until 3 hours before your surgery.   ? ?Your role in recovery ?Your role is to become active as soon as directed by your doctor, while still giving yourself time to heal.  Rest when you feel tired. You will be asked to do the following in order to speed your recovery: ? ?- Cough and breathe deeply. This helps to clear and expand your lungs and can prevent pneumonia after surgery.  ?- STAY ACTIVE WHEN YOU GET HOME. Do mild physical activity. Walking or moving your legs help your circulation and body functions return to normal. Do not try to get up or walk alone the first time after surgery.   ?-If you develop swelling on one leg or the other, pain in the back of your leg, redness/warmth in one of your legs, please call the office or go to the Emergency Room to have a doppler to rule out a blood clot. For shortness of breath, chest pain-seek care in the Emergency Room as soon as possible. ?- Actively manage your pain. Managing your pain lets you move in comfort. We will ask you to rate  your pain on a scale of zero to 10. It is your responsibility to tell your doctor or nurse where and how much you hurt so your pain can be treated. ? ?Special Considerations ?-Your final pathology results from surgery should be available around one week after surgery and the results will be relayed to you when available. ? ?-FMLA forms can be faxed to [phone] and please allow 5-7 business days for completion. ? ?Pain Management After Surgery ?-You have been prescribed your pain medication and bowel regimen medications before surgery so that you can have these available when you are discharged from the hospital. The pain medication is for use ONLY AFTER surgery and a new prescription will not be given.  ? ?-Make sure that you have Tylenol and Ibuprofen at home to use on a regular basis after surgery for pain control. We recommend alternating the medications every hour to six hours since they work differently and are processed in the body differently for pain relief. ? ?-Review the attached handout on narcotic use and their risks and side effects.  ? ?Bowel Regimen ?-You have been prescribed Sennakot-S to take nightly to prevent constipation especially if you are taking the narcotic pain medication intermittently.  It is important to prevent constipation and drink adequate amounts of liquids. You can stop taking this medication when you are not taking pain medication and you are back on your normal bowel routine. ? ?Risks of Surgery ?Risks of  surgery are low but include bleeding, infection, damage to surrounding structures, re-operation, blood clots, and very rarely death. ? ?AFTER SURGERY INSTRUCTIONS ? ?Return to work:  1-2 weeks if applicable ? ?We recommend purchasing several bags of frozen green peas and dividing them into ziploc bags. You will want to keep these in the freezer and have them ready to use as ice packs to the vulvar incision. Once the ice pack is no longer cold, you can get another from the  freezer. The frozen peas mold to your body better than a regular ice pack.  ? ?Activity: ?1. Be up and out of the bed during the day.  Take a nap if needed.  You may walk up steps but be careful and use the hand rail.  Stair climbing will tire you more than you think, you may need to stop part way and rest.  ? ?2. No lifting or straining for 2 weeks over 10 pounds. No pushing, pulling, straining for 2 weeks. ? ?3. No driving for minimum 24 hours after surgery.  Do not drive if you are taking narcotic pain medicine and make sure that your reaction time has returned.  ? ?4. You can shower as soon as the next day after surgery. Shower daily. No tub baths or submerging your body in water until cleared by your surgeon.  ? ?5. No sexual activity and nothing in the vagina for 4 weeks or until seen in the office. ? ?6. You may experience vulvar spotting and discharge after surgery.  The spotting is normal but if you experience heavy bleeding, call our office. ? ?7. Take Tylenol or ibuprofen first for pain and only use narcotic pain medication for severe pain not relieved by the Tylenol or Ibuprofen.  Monitor your Tylenol intake to a max of 4,000 mg in a 24 hour period. You can alternate these medications after surgery. ? ?Diet: ?1. Low sodium Heart Healthy Diet is recommended but you are cleared to resume your normal (before surgery) diet after your procedure. ? ?2. It is safe to use a laxative, such as Miralax or Colace, if you have difficulty moving your bowels. You have been prescribed Sennakot at bedtime every evening to keep bowel movements regular and to prevent constipation.   ? ?Wound Care: ?1. Keep clean and dry.  Shower daily. ? ?Reasons to call the Doctor: ?Fever - Oral temperature greater than 100.4 degrees Fahrenheit ?Foul-smelling vaginal discharge ?Difficulty urinating ?Nausea and vomiting ?Increased pain at the site of the incision that is unrelieved with pain medicine. ?Difficulty breathing with or without  chest pain ?New calf pain especially if only on one side ?Sudden, continuing increased vaginal bleeding with or without clots. ?  ?Contacts: ?For questions or concerns you should contact: ? ?Dr. Jeral Pinch at [phone] ? ?Joylene John, NP at [phone] ? ?After Hours: call [phone] and have the GYN Oncologist paged/contacted (after 5 pm or on the weekends). ? ?Messages sent via mychart are for non-urgent matters and are not responded to after hours so for urgent needs, please call the after hours number. ? ? ? ?  ?

## 2021-06-29 NOTE — Patient Instructions (Signed)
Preparing for your Surgery ?  ?Plan for surgery on July 03, 2021 with Dr. Jeral Pinch at [facility]. You will be scheduled for wide local excisions of the vulva, possible vulvar biopsies, possible vulvar laser.  ?  ?Pre-operative Testing ?-You will receive a phone call from presurgical testing at [facility] to discuss surgery instructions and arrange for lab work if needed. ?  ?-Bring your insurance card, copy of an advanced directive if applicable, medication list. ?  ?-You should not be taking blood thinners or aspirin at least ten days prior to surgery unless instructed by your surgeon. ?  ?-Do not take supplements such as fish oil (omega 3), red yeast rice, turmeric before your surgery. You want to avoid medications with aspirin in them including headache powders such as BC or Goody's), Excedrin migraine. ?  ?Day Before Surgery at Home ?-You will be advised you can have clear liquids up until 3 hours before your surgery.   ?  ?Your role in recovery ?Your role is to become active as soon as directed by your doctor, while still giving yourself time to heal.  Rest when you feel tired. You will be asked to do the following in order to speed your recovery: ?  ?- Cough and breathe deeply. This helps to clear and expand your lungs and can prevent pneumonia after surgery.  ?- STAY ACTIVE WHEN YOU GET HOME. Do mild physical activity. Walking or moving your legs help your circulation and body functions return to normal. Do not try to get up or walk alone the first time after surgery.   ?-If you develop swelling on one leg or the other, pain in the back of your leg, redness/warmth in one of your legs, please call the office or go to the Emergency Room to have a doppler to rule out a blood clot. For shortness of breath, chest pain-seek care in the Emergency Room as soon as possible. ?- Actively manage your pain. Managing your pain lets you move in comfort. We will ask you to  rate your pain on a scale of zero to 10. It is your responsibility to tell your doctor or nurse where and how much you hurt so your pain can be treated. ?  ?Special Considerations ?-Your final pathology results from surgery should be available around one week after surgery and the results will be relayed to you when available. ?  ?-FMLA forms can be faxed to [phone] and please allow 5-7 business days for completion. ?  ?Pain Management After Surgery ?-You have been prescribed your pain medication and bowel regimen medications before surgery so that you can have these available when you are discharged from the hospital. The pain medication is for use ONLY AFTER surgery and a new prescription will not be given.  ?  ?-Make sure that you have Tylenol and Ibuprofen at home to use on a regular basis after surgery for pain control. We recommend alternating the medications every hour to six hours since they work differently and are processed in the body differently for pain relief. ?  ?-Review the attached handout on narcotic use and their risks and side effects.  ?  ?Bowel Regimen ?-You have been prescribed Sennakot-S to take nightly to prevent constipation especially if you are taking the narcotic pain medication intermittently.  It is important to prevent constipation and drink adequate amounts of liquids. You can stop taking this medication when you are not taking pain medication and  you are back on your normal bowel routine. ?  ?Risks of Surgery ?Risks of surgery are low but include bleeding, infection, damage to surrounding structures, re-operation, blood clots, and very rarely death. ?  ?AFTER SURGERY INSTRUCTIONS ?  ?Return to work:  1-2 weeks if applicable ?  ?We recommend purchasing several bags of frozen green peas and dividing them into ziploc bags. You will want to keep these in the freezer and have them ready to use as ice packs to the vulvar incision. Once the ice pack is no longer cold, you can get  another from the freezer. The frozen peas mold to your body better than a regular ice pack.  ?  ?Activity: ?1. Be up and out of the bed during the day.  Take a nap if needed.  You may walk up steps but be careful and use the hand rail.  Stair climbing will tire you more than you think, you may need to stop part way and rest.  ?  ?2. No lifting or straining for 2 weeks over 10 pounds. No pushing, pulling, straining for 2 weeks. ?  ?3. No driving for minimum 24 hours after surgery.  Do not drive if you are taking narcotic pain medicine and make sure that your reaction time has returned.  ?  ?4. You can shower as soon as the next day after surgery. Shower daily. No tub baths or submerging your body in water until cleared by your surgeon.  ?  ?5. No sexual activity and nothing in the vagina for 4 weeks or until seen in the office. ?  ?6. You may experience vulvar spotting and discharge after surgery.  The spotting is normal but if you experience heavy bleeding, call our office. ?  ?7. Take Tylenol or ibuprofen first for pain and only use narcotic pain medication for severe pain not relieved by the Tylenol or Ibuprofen.  Monitor your Tylenol intake to a max of 4,000 mg in a 24 hour period. You can alternate these medications after surgery. ?  ?Diet: ?1. Low sodium Heart Healthy Diet is recommended but you are cleared to resume your normal (before surgery) diet after your procedure. ?  ?2. It is safe to use a laxative, such as Miralax or Colace, if you have difficulty moving your bowels. You have been prescribed Sennakot at bedtime every evening to keep bowel movements regular and to prevent constipation.   ?  ?Wound Care: ?1. Keep clean and dry.  Shower daily. ?  ?Reasons to call the Doctor: ?Fever - Oral temperature greater than 100.4 degrees Fahrenheit ?Foul-smelling vaginal discharge ?Difficulty urinating ?Nausea and vomiting ?Increased pain at the site of the incision that is unrelieved with pain  medicine. ?Difficulty breathing with or without chest pain ?New calf pain especially if only on one side ?Sudden, continuing increased vaginal bleeding with or without clots. ?  ?Contacts: ?For questions or concerns you should contact: ?  ?Dr. Jeral Pinch at [phone] ?  ?Joylene John, NP at [phone] ?  ?After Hours: call [phone] and have the GYN Oncologist paged/contacted (after 5 pm or on the weekends). ?  ?Messages sent via mychart are for non-urgent matters and are not responded to after hours so for urgent needs, please call the after hours number. ?

## 2021-06-29 NOTE — Progress Notes (Signed)
Patient here for new patient consultation with Dr. Jeral Pinch and for a pre-operative discussion prior to her scheduled surgery on July 03, 2021. She is scheduled for wide local excisions of the vulva, possible vulvar biopsies, possible vulvar laser. The surgery was discussed in detail.  See after visit summary for additional details.    ?  ?Discussed post-op pain management in detail including the aspects of the enhanced recovery pathway.  Advised her that a new prescription would be sent in for hydrocodone/APAP and it is only to be used for after her upcoming surgery.  We discussed the use of tylenol post-op and to monitor for a maximum of 4,000 mg in a 24 hour period.  Also prescribed sennakot to be used after surgery and to hold if having loose stools.    ?  ?Discussed measures to take at home to prevent DVT including frequent mobility.  Reportable signs and symptoms of DVT discussed. Post-operative instructions discussed and expectations for after surgery. Incisional care discussed as well including reportable signs and symptoms including erythema, drainage, wound separation.  ?   ?5 minutes spent with the patient.  Verbalizing understanding of material discussed. No needs or concerns voiced at the end of the visit.   Advised patient to call for any needs.  Advised that her post-operative medications had been prescribed and could be picked up at any time.   ? ?This appointment is included in the global surgical bundle as pre-operative teaching and has no charge.     ?

## 2021-07-02 ENCOUNTER — Telehealth: Payer: Self-pay | Admitting: *Deleted

## 2021-07-02 NOTE — Telephone Encounter (Signed)
Telephone call to check on pre-operative status.  Patient compliant with pre-operative instructions.  Reinforced nothing to eat after midnight. Clear liquids until 0945. Patient to arrive at 0845.  No questions or concerns voiced.  Instructed to call for any needs.  ?

## 2021-07-02 NOTE — Telephone Encounter (Signed)
Attempted to review pre sx procedure with pt. Unable to reach pt. LVM for a call back.  ?

## 2021-07-03 ENCOUNTER — Encounter (HOSPITAL_BASED_OUTPATIENT_CLINIC_OR_DEPARTMENT_OTHER)
Admission: RE | Disposition: A | Discharge status: Home / Self Care | Payer: Self-pay | Source: Home / Self Care | Attending: Gynecologic Oncology

## 2021-07-03 ENCOUNTER — Ambulatory Visit (HOSPITAL_BASED_OUTPATIENT_CLINIC_OR_DEPARTMENT_OTHER): Payer: PRIVATE HEALTH INSURANCE | Admitting: Anesthesiology

## 2021-07-03 ENCOUNTER — Ambulatory Visit (HOSPITAL_BASED_OUTPATIENT_CLINIC_OR_DEPARTMENT_OTHER)
Admission: RE | Admit: 2021-07-03 | Discharge: 2021-07-03 | Disposition: A | Discharge status: Home / Self Care | Payer: PRIVATE HEALTH INSURANCE | Attending: Gynecologic Oncology | Admitting: Gynecologic Oncology

## 2021-07-03 ENCOUNTER — Encounter (HOSPITAL_BASED_OUTPATIENT_CLINIC_OR_DEPARTMENT_OTHER): Payer: Self-pay | Admitting: Gynecologic Oncology

## 2021-07-03 ENCOUNTER — Other Ambulatory Visit: Payer: Self-pay

## 2021-07-03 DIAGNOSIS — R519 Headache, unspecified: Secondary | ICD-10-CM | POA: Diagnosis not present

## 2021-07-03 DIAGNOSIS — D071 Carcinoma in situ of vulva: Secondary | ICD-10-CM | POA: Diagnosis present

## 2021-07-03 DIAGNOSIS — G35 Multiple sclerosis: Secondary | ICD-10-CM | POA: Insufficient documentation

## 2021-07-03 DIAGNOSIS — E0500 Thyrotoxicosis with diffuse goiter without thyrotoxic crisis or storm: Secondary | ICD-10-CM | POA: Diagnosis not present

## 2021-07-03 DIAGNOSIS — F419 Anxiety disorder, unspecified: Secondary | ICD-10-CM | POA: Diagnosis not present

## 2021-07-03 HISTORY — DX: Palpitations: R00.2

## 2021-07-03 HISTORY — DX: Dysplasia of vulva, unspecified: N90.3

## 2021-07-03 HISTORY — DX: Other chronic pain: G89.29

## 2021-07-03 HISTORY — DX: Personal history of other diseases of the female genital tract: Z87.42

## 2021-07-03 LAB — POCT PREGNANCY, URINE: Preg Test, Ur: NEGATIVE

## 2021-07-03 SURGERY — WIDE EXCISION VULVECTOMY
Anesthesia: General | Site: Vulva

## 2021-07-03 MED ORDER — MIDAZOLAM HCL 5 MG/5ML IJ SOLN
INTRAMUSCULAR | Status: DC | PRN
  Administered 2021-07-03: 2 mg via INTRAVENOUS

## 2021-07-03 MED ORDER — ACETAMINOPHEN 500 MG PO TABS
ORAL_TABLET | ORAL | Status: AC
  Filled 2021-07-03: qty 2

## 2021-07-03 MED ORDER — ONDANSETRON HCL 4 MG/2ML IJ SOLN: 4.0000 mg | Freq: Once | INTRAMUSCULAR | Status: DC | PRN

## 2021-07-03 MED ORDER — ONDANSETRON HCL 4 MG/2ML IJ SOLN: 4.0000 mg | Freq: Four times a day (QID) | INTRAMUSCULAR | Status: DC | PRN

## 2021-07-03 MED ORDER — FENTANYL CITRATE (PF) 100 MCG/2ML IJ SOLN
INTRAMUSCULAR | Status: DC | PRN
  Administered 2021-07-03 (×2): 50 ug via INTRAVENOUS

## 2021-07-03 MED ORDER — ONDANSETRON HCL 4 MG/2ML IJ SOLN
INTRAMUSCULAR | Status: DC | PRN
Start: 2021-07-03 — End: 2021-07-03
  Administered 2021-07-03: 4 mg via INTRAVENOUS

## 2021-07-03 MED ORDER — MIDAZOLAM HCL 2 MG/2ML IJ SOLN
INTRAMUSCULAR | Status: AC
  Filled 2021-07-03: qty 2

## 2021-07-03 MED ORDER — GABAPENTIN 300 MG PO CAPS
300.0000 mg | ORAL_CAPSULE | ORAL | Status: AC
  Administered 2021-07-03: 300 mg via ORAL

## 2021-07-03 MED ORDER — BUPIVACAINE HCL 0.25 % IJ SOLN
INTRAMUSCULAR | Status: DC | PRN
Start: 2021-07-03 — End: 2021-07-03
  Administered 2021-07-03: 10 mL

## 2021-07-03 MED ORDER — LACTATED RINGERS IV SOLN: INTRAVENOUS | Status: DC

## 2021-07-03 MED ORDER — STERILE WATER FOR IRRIGATION IR SOLN
Status: DC | PRN
  Administered 2021-07-03: 500 mL

## 2021-07-03 MED ORDER — FENTANYL CITRATE (PF) 100 MCG/2ML IJ SOLN: 25.0000 ug | INTRAMUSCULAR | Status: DC | PRN

## 2021-07-03 MED ORDER — ACETAMINOPHEN 500 MG PO TABS
1000.0000 mg | ORAL_TABLET | ORAL | Status: AC
  Administered 2021-07-03: 1000 mg via ORAL

## 2021-07-03 MED ORDER — HYDROCODONE-ACETAMINOPHEN 5-325 MG PO TABS: 1.0000 | ORAL_TABLET | ORAL | Status: DC | PRN

## 2021-07-03 MED ORDER — PROPOFOL 10 MG/ML IV BOLUS
INTRAVENOUS | Status: AC
Start: 2021-07-03 — End: ?
  Filled 2021-07-03: qty 20

## 2021-07-03 MED ORDER — ACETAMINOPHEN 160 MG/5ML PO SOLN: 325.0000 mg | ORAL | Status: DC | PRN

## 2021-07-03 MED ORDER — GABAPENTIN 300 MG PO CAPS
ORAL_CAPSULE | ORAL | Status: AC
  Filled 2021-07-03: qty 1

## 2021-07-03 MED ORDER — DEXAMETHASONE SODIUM PHOSPHATE 10 MG/ML IJ SOLN: 4.0000 mg | INTRAMUSCULAR | Status: DC

## 2021-07-03 MED ORDER — FENTANYL CITRATE (PF) 100 MCG/2ML IJ SOLN
INTRAMUSCULAR | Status: AC
  Filled 2021-07-03: qty 2

## 2021-07-03 MED ORDER — ACETIC ACID 5 % SOLN
Status: DC | PRN
  Administered 2021-07-03: 1 via TOPICAL

## 2021-07-03 MED ORDER — DEXAMETHASONE SODIUM PHOSPHATE 4 MG/ML IJ SOLN
INTRAMUSCULAR | Status: DC | PRN
  Administered 2021-07-03: 10 mg via INTRAVENOUS

## 2021-07-03 MED ORDER — MEPERIDINE HCL 25 MG/ML IJ SOLN: 6.2500 mg | INTRAMUSCULAR | Status: DC | PRN

## 2021-07-03 MED ORDER — SCOPOLAMINE 1 MG/3DAYS TD PT72
MEDICATED_PATCH | TRANSDERMAL | Status: AC
  Filled 2021-07-03: qty 1

## 2021-07-03 MED ORDER — PROPOFOL 10 MG/ML IV BOLUS
INTRAVENOUS | Status: DC | PRN
  Administered 2021-07-03: 160 mg via INTRAVENOUS

## 2021-07-03 MED ORDER — SCOPOLAMINE 1 MG/3DAYS TD PT72
1.0000 | MEDICATED_PATCH | TRANSDERMAL | Status: DC
  Administered 2021-07-03: 1.5 mg via TRANSDERMAL

## 2021-07-03 MED ORDER — LIDOCAINE HCL (CARDIAC) PF 100 MG/5ML IV SOSY
PREFILLED_SYRINGE | INTRAVENOUS | Status: DC | PRN
  Administered 2021-07-03: 40 mg via INTRAVENOUS

## 2021-07-03 MED ORDER — ACETAMINOPHEN 325 MG PO TABS: 325.0000 mg | ORAL_TABLET | ORAL | Status: DC | PRN

## 2021-07-03 MED ORDER — KETOROLAC TROMETHAMINE 15 MG/ML IJ SOLN: 15.0000 mg | INTRAMUSCULAR | Status: DC

## 2021-07-03 MED ORDER — KETOROLAC TROMETHAMINE 30 MG/ML IJ SOLN
INTRAMUSCULAR | Status: DC | PRN
  Administered 2021-07-03: 30 mg via INTRAVENOUS

## 2021-07-03 MED ORDER — ONDANSETRON HCL 4 MG PO TABS: 4.0000 mg | ORAL_TABLET | Freq: Four times a day (QID) | ORAL | Status: DC | PRN

## 2021-07-03 SURGICAL SUPPLY — 28 items
BLADE SURG 15 STRL LF DISP TIS (BLADE) ×2 IMPLANT
BLADE SURG 15 STRL SS (BLADE) ×3
DEPRESSOR TONGUE BLADE STERILE (MISCELLANEOUS) ×3 IMPLANT
GAUZE 4X4 16PLY ~~LOC~~+RFID DBL (SPONGE) ×6 IMPLANT
GLOVE SURG ENC MOIS LTX SZ6 (GLOVE) ×6 IMPLANT
GLOVE SURG POLYISO LF SZ8.5 (GLOVE) ×2 IMPLANT
GLOVE SURG UNDER POLY LF SZ6.5 (GLOVE) ×2 IMPLANT
GLOVE SURG UNDER POLY LF SZ8.5 (GLOVE) ×2 IMPLANT
GOWN STRL REUS W/TWL LRG LVL3 (GOWN DISPOSABLE) ×5 IMPLANT
KIT TURNOVER CYSTO (KITS) ×3 IMPLANT
MANIFOLD NEPTUNE II (INSTRUMENTS) ×2 IMPLANT
NDL HYPO 25X1 1.5 SAFETY (NEEDLE) ×1 IMPLANT
NEEDLE HYPO 25X1 1.5 SAFETY (NEEDLE) ×3 IMPLANT
PACK PERINEAL COLD (PAD) ×3 IMPLANT
PACK VAGINAL WOMENS (CUSTOM PROCEDURE TRAY) ×3 IMPLANT
PAD PREP 24X48 CUFFED NSTRL (MISCELLANEOUS) ×3 IMPLANT
SUT VIC AB 0 SH 27 (SUTURE) ×5 IMPLANT
SUT VIC AB 2-0 SH 27 (SUTURE) ×6
SUT VIC AB 2-0 SH 27XBRD (SUTURE) ×2 IMPLANT
SUT VIC AB 3-0 SH 27 (SUTURE) ×3
SUT VIC AB 3-0 SH 27X BRD (SUTURE) ×2 IMPLANT
SUT VIC AB 4-0 PS2 18 (SUTURE) ×5 IMPLANT
SWAB OB GYN 8IN STERILE 2PK (MISCELLANEOUS) ×6 IMPLANT
SYR BULB IRRIG 60ML STRL (SYRINGE) ×3 IMPLANT
TOWEL OR 17X26 10 PK STRL BLUE (TOWEL DISPOSABLE) ×3 IMPLANT
TUBE CONNECTING 12X1/4 (SUCTIONS) ×2 IMPLANT
VACUUM HOSE 7/8X10 W/ WAND (MISCELLANEOUS) ×2 IMPLANT
WATER STERILE IRR 500ML POUR (IV SOLUTION) ×3 IMPLANT

## 2021-07-03 NOTE — Interval H&P Note (Signed)
History and Physical Interval Note: ? ?07/03/2021 ?9:40 AM ? ?Kristin Stewart  has presented today for surgery, with the diagnosis of VULVAR INTRAEPITHELIAL NEOPLASIA THREE.  The various methods of treatment have been discussed with the patient and family. After consideration of risks, benefits and other options for treatment, the patient has consented to  Procedure(s): ?WIDE EXCISION VULVECTOMY (N/A) ?VULVAR BIOPSY (N/A) ?CO2 LASER APPLICATION (N/A) as a surgical intervention.  The patient's history has been reviewed, patient examined, no change in status, stable for surgery.  I have reviewed the patient's chart and labs.  Questions were answered to the patient's satisfaction.   ? ? ?Carver Fila ? ? ?

## 2021-07-03 NOTE — Op Note (Signed)
PATIENT: Kristin Stewart ? ?ENCOUNTER DATE: 07/03/21 ? ?Preop Diagnosis: VIN3 ? ?Postoperative Diagnosis: same as above ? ?Surgery: Partial simple right vulvectomy x2, simple left vulvectomy x1 ? ?Surgeons:  Carin Hock, MD ? ?Assistant: Warner Mccreedy, NP ? ?Estimated blood loss: 50 ml ? ?IVF:  see I&O flowsheet  ? ?Urine output: n/a  ? ?Complications: None apparent ? ?Pathology: Right posterior vulva at 7 o'c with marking stitch at 12 o'clock; right mid vulva at 9 o'cl stitch at 12 o'c; left posterior vulva at 5 o'c with stitch at 12 o'c ? ?Operative findings: Three small areas of acetowhite after application of acetic acid, all measuring 1cm or less. Two along posterior vulva/perineum at 5 and 7 o'cl. One very faint lesion along lateral aspect of right vulva at 9 o'c. ? ?Procedure: The patient was identified in the preoperative holding area. Informed consent was signed on the chart. Patient was seen history was reviewed and exam was performed.  ? ?The patient was then taken to the operating room and placed in the supine position with SCD hose on. General anesthesia was then induced without difficulty. She was then placed in the dorsolithotomy position. The perineum was prepped with Betadine. The vagina was prepped with Betadine. The patient was then draped after the prep was dried.  ? ?Timeout was performed the patient, procedure, antibiotic, allergy, and length of procedure. 5% acetic acid solution was applied to the perineum. The vulvar tissues were inspected for areas of acetowhite changes or leukoplakia. The lesions were identified and the marking pen was used to circumscribe the area with appropriate surgical margins. The subcuticular tissues were infiltrated with 1% lidocaine. The 15 blade scalpel was used to make an incision through the skin circumferentially as marked around all incisions. The skin elipse was grasped and was separated from the underlying deep dermal tissues with the scalpel. After each  specimen had been completely resected, it was oriented and marked at 12 o'clock with a vicryl suture. The bovie was used to obtain hemostasis at the surgical beds. The subcutaneous tissues were irrigated and made hemostatic.  ? ?The deep dermal layer was approximated with 2-0 and 3-0 vicryl mattress sutures to bring the skin edges into approximation and off tension. The wound was closed following langher's lines. The cutaneous layer was closed with interrupted 4-0 vicryl stitches and mattress sutures to ensure a tension free and hemostatic closure. The perineum was again irrigated.  ? ?All instrument, suture, laparotomy, Ray-Tec, and needle counts were correct x2. The patient tolerated the procedure well and was taken recovery room in stable condition.  ? ?Eugene Garnet MD ?Gynecologic Oncology ? ? ?

## 2021-07-03 NOTE — Anesthesia Procedure Notes (Signed)
Procedure Name: LMA Insertion ?Date/Time: 07/03/2021 10:29 AM ?Performed by: Jessica Priest, CRNA ?Pre-anesthesia Checklist: Patient identified, Emergency Drugs available, Suction available, Patient being monitored and Timeout performed ?Patient Re-evaluated:Patient Re-evaluated prior to induction ?Oxygen Delivery Method: Circle system utilized ?Preoxygenation: Pre-oxygenation with 100% oxygen ?Induction Type: IV induction ?Ventilation: Mask ventilation without difficulty ?LMA: LMA inserted ?LMA Size: 4.0 ?Number of attempts: 1 ?Airway Equipment and Method: Bite block ?Placement Confirmation: positive ETCO2, breath sounds checked- equal and bilateral and CO2 detector ?Tube secured with: Tape ?Dental Injury: Teeth and Oropharynx as per pre-operative assessment  ? ? ? ? ?

## 2021-07-03 NOTE — Anesthesia Preprocedure Evaluation (Signed)
Anesthesia Evaluation  ?Patient identified by MRN, date of birth, ID band ?Patient awake ? ? ? ?Reviewed: ?Allergy & Precautions, H&P , NPO status , Patient's Chart, lab work & pertinent test results, reviewed documented beta blocker date and time  ? ?History of Anesthesia Complications ?(+) PONV and history of anesthetic complications ? ?Airway ?Mallampati: I ? ? ?Neck ROM: full ? ? ? Dental ?no notable dental hx. ? ?  ?Pulmonary ? ?  ?Pulmonary exam normal ? ? ? ? ? ? ? Cardiovascular ?+ Peripheral Vascular Disease  ?Normal cardiovascular exam ? ? ?  ?Neuro/Psych ? Headaches, PSYCHIATRIC DISORDERS Anxiety Cerebral aneurysm.  H/o multiple sclerosis. ?  ? GI/Hepatic ?negative GI ROS, Neg liver ROS,   ?Endo/Other  ?negative endocrine ROS ? Renal/GU ?negative Renal ROS  ?negative genitourinary ?  ?Musculoskeletal ?negative musculoskeletal ROS ?(+)  ? Abdominal ?Normal abdominal exam  (+)   ?Peds ? Hematology ?negative hematology ROS ?(+)   ?Anesthesia Other Findings ? ? Reproductive/Obstetrics ? ?  ? ? ? ? ? ? ? ? ? ? ? ? ? ?  ?  ? ? ? ? ? ? ? ? ?Anesthesia Physical ? ?Anesthesia Plan ? ?ASA: II ? ?Anesthesia Plan: General  ? ?Post-op Pain Management: Dilaudid IV, Tylenol PO (pre-op)* and Toradol IV (intra-op)*  ? ?Induction: Intravenous ? ?PONV Risk Score and Plan: 3 and Ondansetron, Dexamethasone and Midazolam ? ?Airway Management Planned: LMA ? ?Additional Equipment: Arterial line ? ?Intra-op Plan:  ? ?Post-operative Plan: Extubation in OR ? ?Informed Consent: I have reviewed the patients History and Physical, chart, labs and discussed the procedure including the risks, benefits and alternatives for the proposed anesthesia with the patient or authorized representative who has indicated his/her understanding and acceptance.  ? ? ? ?Dental advisory given ? ?Plan Discussed with: CRNA ? ?Anesthesia Plan Comments:   ? ? ? ? ? ? ?Anesthesia Quick Evaluation ? ?

## 2021-07-03 NOTE — Discharge Instructions (Addendum)
07/03/2021 ? ?Return to work: when ready ? ?We recommend purchasing several bags of frozen green peas and dividing them into ziploc bags. You will want to keep these in the freezer and have them ready to use as ice packs to the vulvar incision. Once the ice pack is no longer cold, you can get another from the freezer. The frozen peas mold to your body better than a regular ice pack.  ? ?Today, Dr. Pricilla Holm performed wide local excisions of the vulva. No lasering of the vulva took place.  ? ?Activity: ?1. Be up and out of the bed during the day.  Take a nap if needed.  You may walk up steps but be careful and use the hand rail.   ? ?2. No lifting or straining for 4-6 weeks ( no more than 10-15 pounds). ? ?3. No driving until off narcotics and you can brake safely. For most patients, this is 3-7 days. Do Not drive if you are taking narcotic pain medicine. ? ?4. Shower daily.  Use soap and water on your incision and pat dry; don't rub.  ? ?5. No sexual activity and nothing in the vagina for 6 weeks. ? ?Medications:  ?- Take ibuprofen and tylenol first line for pain control. Take these regularly (every 6 hours) to decrease the build up of pain. ? ?- If necessary, for severe pain not relieved by ibuprofen, take norco. ? ?- While taking norco you should take sennakot every night to reduce the likelihood of constipation. If this causes diarrhea, stop its use. ? ?Diet: ?1. Low sodium Heart Healthy Diet is recommended. ? ?2. It is safe to use a laxative if you have difficulty moving your bowels. Please take the sennakot-S nightly for the first week after surgery and if needed after. ? ?Wound Care: ?1. Keep clean and dry.  Shower daily. ? ?Reasons to call the Doctor: ? ?Fever - Oral temperature greater than 100.4 degrees Fahrenheit ?Foul-smelling vaginal discharge ?Difficulty urinating ?Nausea and vomiting ?Increased pain at the site of the incision that is unrelieved with pain medicine. ?Difficulty breathing with or without  chest pain ?New calf pain especially if only on one side ?Sudden, continuing increased vaginal bleeding with or without clots. ?  ?Follow-up: ?1. See Eugene Garnet in 3 weeks as scheduled. ? ?Contacts: ?For questions or concerns you should contact: ? ?Dr. Eugene Garnet at [phone] ? ?After hours and on week-ends call [phone] and ask to speak to the physician on call for Gynecologic Oncology ? ?Post Anesthesia Home Care Instructions ? ?Activity: ?Get plenty of rest for the remainder of the day. A responsible individual must stay with you for 24 hours following the procedure.  ?For the next 24 hours, DO NOT: ?-Drive a car ?-Advertising copywriter ?-Drink alcoholic beverages ?-Take any medication unless instructed by your physician ?-Make any legal decisions or sign important papers. ? ?Meals: ?Start with liquid foods such as gelatin or soup. Progress to regular foods as tolerated. Avoid greasy, spicy, heavy foods. If nausea and/or vomiting occur, drink only clear liquids until the nausea and/or vomiting subsides. Call your physician if vomiting continues. ? ?Special Instructions/Symptoms: ?Your throat may feel dry or sore from the anesthesia or the breathing tube placed in your throat during surgery. If this causes discomfort, gargle with warm salt water. The discomfort should disappear within 24 hours. ? ?If you had a scopolamine patch placed behind your ear for the management of post- operative nausea and/or vomiting: ? ?1. The medication  in the patch is effective for 72 hours, after which it should be removed.  Wrap patch in a tissue and discard in the trash. Wash hands thoroughly with soap and water. ?2. You may remove the patch earlier than 72 hours if you experience unpleasant side effects which may include dry mouth, dizziness or visual disturbances. ?3. Avoid touching the patch. Wash your hands with soap and water after contact with the patch. ?   ?No acetaminophen/Tylenol until after 3:30 pm today  if needed. ?No ibuprofen, Advil, Aleve, Motrin, ketorolac, meloxicam, naproxen, or other NSAIDS until after 5:00 pm today if needed. ?

## 2021-07-03 NOTE — Transfer of Care (Signed)
Immediate Anesthesia Transfer of Care Note ? ?Patient: Kristin Stewart ? ?Procedure(s) Performed: Procedure(s) (LRB): ?WIDE EXCISION VULVECTOMY x3 (N/A) ?VULVAR BIOPSY (N/A) ? ?Patient Location: PACU ? ?Anesthesia Type: General ? ?Level of Consciousness: awake, sedated, patient cooperative and responds to stimulation ? ?Airway & Oxygen Therapy: Patient Spontanous Breathing and Patient connected to [place] 02 and soft FM  ? ?Post-op Assessment: Report given to PACU RN, Post -op Vital signs reviewed and stable and Patient moving all extremities ? ?Post vital signs: Reviewed and stable ? ?Complications: No apparent anesthesia complications ?

## 2021-07-04 ENCOUNTER — Encounter (HOSPITAL_BASED_OUTPATIENT_CLINIC_OR_DEPARTMENT_OTHER): Payer: Self-pay | Admitting: Gynecologic Oncology

## 2021-07-04 ENCOUNTER — Telehealth: Payer: Self-pay

## 2021-07-04 LAB — SURGICAL PATHOLOGY

## 2021-07-04 NOTE — Telephone Encounter (Signed)
LM for return call for f/u after surgery with Dr. Berline Lopes yesterday.  ?

## 2021-07-04 NOTE — Telephone Encounter (Signed)
Spoke with Kristin Stewart this morning. She states she is eating, drinking and urinating well. She has not had a BM yet but is passing gas. She is taking senokot as prescribed and encouraged her to drink plenty of water. She denies fever or chills. Incisions are dry and intact. She rates her pain 0/10 and states everything is going well. ? ?Instructed to call office with any fever, chills, purulent drainage, uncontrolled pain or any other questions or concerns. Patient verbalizes understanding.  ? ?Pt informed to call if she has any questions or concerns. ?

## 2021-07-05 NOTE — Anesthesia Postprocedure Evaluation (Signed)
Anesthesia Post Note ? ?Patient: Kristin Stewart ? ?Procedure(s) Performed: WIDE EXCISION VULVECTOMY x3 (Vulva) ?VULVAR BIOPSY (Vulva) ? ?  ? ?Patient location during evaluation: PACU ?Anesthesia Type: General ?Level of consciousness: awake ?Pain management: pain level controlled ?Vital Signs Assessment: post-procedure vital signs reviewed and stable ?Respiratory status: spontaneous breathing ?Cardiovascular status: stable ?Postop Assessment: no apparent nausea or vomiting ?Anesthetic complications: no ? ? ?No notable events documented. ? ?Last Vitals:  ?Vitals:  ? 07/03/21 1145 07/03/21 1154  ?BP: 125/74 131/86  ?Pulse: 81 90  ?Resp: (!) 24 18  ?Temp: 36.7 ?C   ?SpO2: 97% 100%  ?  ?Last Pain:  ?Vitals:  ? 07/03/21 1215  ?TempSrc:   ?PainSc: 0-No pain  ? ? ?  ?  ?  ?  ?  ?  ? ?Caren Macadam ? ? ? ? ?

## 2021-07-06 ENCOUNTER — Other Ambulatory Visit: Payer: Self-pay | Admitting: Gynecologic Oncology

## 2021-07-06 DIAGNOSIS — T8189XA Other complications of procedures, not elsewhere classified, initial encounter: Secondary | ICD-10-CM

## 2021-07-06 MED ORDER — CEPHALEXIN 500 MG PO CAPS: 500.0000 mg | ORAL_CAPSULE | Freq: Four times a day (QID) | ORAL | 0 refills | Status: AC

## 2021-07-06 NOTE — Progress Notes (Signed)
Patient called in to report yellow/brown, foul smelling discharge. Denies erythema, pain or swelling of incisions. Given weekend and unable to assess for possible infection until Monday, I sent script for Keflex to her pharmacy. Encouraged sitz baths and using squirt bottle. ? ?Eugene Garnet MD ?Gynecologic Oncology ? ?

## 2021-07-09 ENCOUNTER — Telehealth: Payer: Self-pay | Admitting: *Deleted

## 2021-07-09 NOTE — Telephone Encounter (Signed)
Spoke with pt this afternoon regarding her after hours triage call from this weekend. Pt stated that she was having a foul odor with her discharge. She denies pain, burning, fever, or chills. She stated since taking the keflex the odor has completely disappeared. She does still have brown,  light red, nickel size discharge with no odor. She says it has improved. She is using the peri bottle to help keep the area clean after using the bathroom. She stated the incision sites look good and feels that one may have "opened" which caused the foul smelling odor. Informed her we would let the providers know. Pt verbalized understanding and did not voice any other concerns.  ?

## 2021-07-20 ENCOUNTER — Ambulatory Visit
Admission: RE | Admit: 2021-07-20 | Discharge: 2021-07-20 | Disposition: A | Discharge status: Home / Self Care | Payer: PRIVATE HEALTH INSURANCE | Source: Ambulatory Visit | Attending: Internal Medicine | Admitting: Internal Medicine

## 2021-07-20 DIAGNOSIS — E0500 Thyrotoxicosis with diffuse goiter without thyrotoxic crisis or storm: Secondary | ICD-10-CM

## 2021-08-09 ENCOUNTER — Encounter: Payer: Self-pay | Admitting: Gynecologic Oncology

## 2021-08-10 ENCOUNTER — Inpatient Hospital Stay: Payer: PRIVATE HEALTH INSURANCE | Attending: Gynecologic Oncology | Admitting: Gynecologic Oncology

## 2021-08-10 ENCOUNTER — Encounter: Payer: Self-pay | Admitting: Gynecologic Oncology

## 2021-08-10 ENCOUNTER — Other Ambulatory Visit: Payer: Self-pay

## 2021-08-10 VITALS — BP 113/73 | HR 97 | Temp 98.7°F | Resp 16 | Ht 64.0 in | Wt 155.4 lb

## 2021-08-10 DIAGNOSIS — D071 Carcinoma in situ of vulva: Secondary | ICD-10-CM

## 2021-08-10 DIAGNOSIS — Z7189 Other specified counseling: Secondary | ICD-10-CM

## 2021-08-10 DIAGNOSIS — Z9079 Acquired absence of other genital organ(s): Secondary | ICD-10-CM

## 2021-08-10 NOTE — Patient Instructions (Signed)
It was good to see you today.  You are healing well from surgery.  I will send a note to your OB/GYN with my recommendations that we discussed today.  Because there can be recurrence of vulvar precancer, I recommend continued close follow-up with visits every 6 months for 5 years and then yearly after.  Please call to be seen sooner if you develop any symptoms or feel/see a new spot on your vulva. ?

## 2021-08-10 NOTE — Progress Notes (Signed)
Gynecologic Oncology Return Clinic Visit ? ?08/10/2021 ? ?Reason for Visit: Postoperative follow-up, treatment discussion ? 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 ?Due to reported foul-smelling drainage, although no other symptoms, patient was treated for presumed cellulitis with Keflex. ? ?Interval History: ?Reports doing well.  Denies any vulvar pain, pruritus, discharge, or bleeding.  Has some minor sensitivity along her healing incision.  Reports regular bowel and bladder function. ? ?Past Medical/Surgical History: ?Past Medical History:  ?Diagnosis Date  ? Anxiety   ? Cerebral aneurysm without rupture 07/2013  ? followed by neurology--   08-12-2013 s/p pipline embolization device to left ICA (cavernous segment)   (last brian MRI/ MRA in care everywhere 07-29-2020  no residual and no recurrence  ? Cervicalgia   ? Chronic headaches   ? Graves disease 03/2021  ? 06-29-2021  per pt followed by pcp,  s/p RAI 03-26-2021, stated lab work is normal  ? History of abnormal cervical Pap smear   ? 1990s  ? History of COVID-19 04/25/2021  ? per pt mild symptoms that resovled  ? MS (multiple sclerosis) ([place])   ? neurologist--- dr Johnette Abraham. pharr  ([facility]);  dx 2000  ? Palpitations   ? 06-29-2021  per pt due to dx graves disease/ hyperthyroidism,  stated takes propranolol if needed, has not needed since RAI 12/ 2022  ? PONV (postoperative nausea and vomiting)   ? Vertigo   ? 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 ? ?Past Surgical History:  ?Procedure Laterality Date  ? BREAST ENHANCEMENT SURGERY Bilateral 2008  ? BREAST IMPLANT REMOVAL Bilateral 05/2016  ? With lift  ? LAPAROSCOPY WITH TUBAL LIGATION  06/08/2007  ? @[facility]  by dr Ronita Hipps;   W/  Cauterization and repair uterine perferation;   after attempted  hysterscopic Essure tubal  complcation by uterine perferation  ? RADIOLOGY WITH ANESTHESIA N/A 08/12/2013  ? Procedure: EMBOLIZATION;  Surgeon: Consuella Lose, MD;  Location: [facility];  Service: Radiology;  Laterality: N/A;  ? TONSILLECTOMY AND ADENOIDECTOMY  1988  ? VULVA /PERINEUM BIOPSY N/A 07/03/2021  ? Procedure: VULVAR BIOPSY;  Surgeon: Lafonda Mosses, MD;  Location: [facility];  Service: Gynecology;  Laterality: N/A;  ? VULVECTOMY N/A 07/03/2021  ? Procedure: WIDE EXCISION VULVECTOMY x3;  Surgeon: Lafonda Mosses, MD;  Location: [facility];  Service: Gynecology;  Laterality: N/A;  ? ? ?Family History  ?Problem Relation Age of Onset  ? Anxiety disorder Mother   ? Depression Mother   ? Diabetes type II Maternal Grandmother   ? Colon cancer Paternal Grandmother   ? Breast cancer Neg Hx   ? Ovarian cancer Neg Hx   ? Endometrial cancer Neg Hx   ? Pancreatic cancer Neg Hx   ? Prostate cancer Neg Hx   ? ? ?Social History  ? ?Socioeconomic History  ? Marital status: Divorced  ?  Spouse name: Not on file  ? Number of children: 0  ? Years of education: college  ? Highest education level: Not on file  ?Occupational History  ? Occupation: PA  ?  Comment: [employer]  ?Tobacco Use  ? Smoking status: Never  ? Smokeless tobacco: Never  ?Vaping Use  ? Vaping Use: Never used  ?Substance and Sexual Activity  ? Alcohol use: Yes  ?  Alcohol/week: 1.0 standard drink  ?  Types: 1 Glasses of wine per week  ?  Comment: occasional  ? Drug use: Never  ? Sexual activity: Not on file  ?Other Topics Concern  ? Not on file  ?Social History Narrative  ? Patient is a PA. Patient works full time .  ? Right handed.  ? Caffeine-two cups daily.  ?   ?   ?   ?   ? ?Social Determinants of Health  ? ?Financial Resource Strain: Not on file  ?Food Insecurity: Not on file  ?Transportation Needs: Not on file  ?Physical Activity: Not on file  ?Stress: Not on file  ?Social Connections: Not on file  ? ? ?Current Medications: ? ?Current  Outpatient Medications:  ?  ASPIRIN 81 PO, Take 81 mg by mouth daily., Disp: , Rfl:  ?  Cholecalciferol 50 MCG (2000 UT) CAPS, Take 4,000 Units by mouth daily., Disp: , Rfl:  ?  clonazePAM (KLONOPIN) 1 MG tablet, Take 1 mg by mouth 2 (two) times daily as needed. Patient took 0.5mg  on 07/02/2021, Disp: , Rfl:  ?  Dimethyl Fumarate (TECFIDERA) 120 & 240 MG MISC, Take 1 capsule by mouth 2 (two) times daily. (Patient taking differently: Take 1 capsule by mouth 2 (two) times daily.), Disp: 180 each, Rfl: 3 ?  diphenhydrAMINE (BENADRYL) 25 MG tablet, Take 25 mg by mouth 2 (two) times daily as needed (flushing). Takes with tecfidera, Disp: , Rfl:  ?  losartan (COZAAR) 100 MG tablet, Take 1 tablet by mouth daily., Disp: , Rfl:  ?  methimazole (TAPAZOLE) 5 MG tablet, , Disp: , Rfl:  ?  propranolol (INDERAL) 10 MG tablet, Take 10 mg by mouth daily as needed., Disp: , Rfl:  ?  venlafaxine XR (EFFEXOR-XR) 75 MG 24 hr capsule, Take 75 mg by mouth daily with breakfast., Disp: , Rfl:  ? ?Review of Systems: ?Denies appetite changes, fevers, chills, fatigue, unexplained weight changes. ?Denies hearing loss, neck lumps or masses, mouth sores, ringing in ears or voice changes. ?Denies cough or wheezing.  Denies shortness of breath. ?Denies chest pain or palpitations. Denies leg swelling. ?Denies abdominal distention, pain, blood in stools, constipation, diarrhea, nausea, vomiting, or early satiety. ?Denies pain with intercourse, dysuria, frequency, hematuria or incontinence. ?Denies hot flashes, pelvic pain, vaginal bleeding or vaginal discharge.   ?Denies joint pain, back pain or muscle pain/cramps. ?Denies itching, rash, or wounds. ?Denies dizziness, headaches, numbness or seizures. ?Denies swollen lymph nodes or glands, denies easy bruising or bleeding. ?Denies anxiety, depression, confusion, or decreased concentration. ? ?Physical Exam: ?BP 113/73 (BP Location: Right Arm, Patient Position: Sitting)   Pulse 97   Temp 98.7 ?F (37.1  ?C) (Oral)   Resp 16   Ht 5\' 4"  (1.626 m)   Wt 155 lb 6.4 oz (70.5 kg)   SpO2 100%   BMI 26.67 kg/m?  ?General: Alert, oriented, no acute distress.  ?HEENT: Normocephalic, atraumatic. Sclera anicteric.  ?Chest: Unlabored breathing on room air. ?Abdomen: Soft, nondistended, nontender to palpation. No masses or hepatosplenomegaly appreciated. ?Extremities: Grossly normal range of motion. Warm, well perfused. No edema bilaterally.  ?Skin: No rashes or lesions.  ?GU: Normal appearing external genitalia without erythema, excoriation, or lesions.  Left vulvar incision healed completely as well as right mid vulvar incision.  Vulvar incision from 7:00 opened at some point and is overall healing well.  No erythema, induration, or exudate. ? ?Laboratory & Radiologic Studies: ?A. VULVA, RIGHT, 7 O'CLOCK, EXCISION:  ?- High-grade vulvar intraepithelial neoplasia, VIN-3.  ?- No  evidence of invasion.  ?- Margins negative for dysplasia.  ? ?B. VULVA, LEFT, 5 O'CLOCK, EXCISION:  ?- High-grade vulvar intraepithelial neoplasia, VIN-3.  ?- No evidence of invasion.  ?- Margins negative for dysplasia.  ? ?C. VULVA, RIGHT, 9 O'CLOCK, EXCISION:  ?- High-grade vulvar intraepithelial neoplasia, VIN-3.  ?- No evidence of invasion.  ?- Margins negative for dysplasia.  ? ?Assessment & Plan: ?Kristin Stewart is a 51 y.o. woman with high-grade vulvar dysplasia, now status post excision, with negative margins. ? ?The patient is doing well from a postoperative standpoint and meeting milestones.  No signs or symptoms of infection.  Discussed continued expectations and restrictions. ? ?In the setting of her vulvar dysplasia, discussed the risk of recurrence and need for close continued follow-up.  I recommend that she see her OB/GYN for an exam every 6 months for 5 years and then annually.  We reviewed signs and symptoms that would be concerning for recurrence of vulvar dysplasia and I stressed the importance of calling her OB/GYN to be seen  sooner if she develops any of these. ? ?22 minutes of total time was spent for this patient encounter, including preparation, face-to-face counseling with the patient and coordination of care, and documentation of the enco

## 2021-08-13 ENCOUNTER — Encounter: Payer: No Typology Code available for payment source | Admitting: Gynecologic Oncology

## 2022-06-04 ENCOUNTER — Other Ambulatory Visit: Payer: Self-pay | Admitting: Obstetrics and Gynecology

## 2022-06-04 DIAGNOSIS — R928 Other abnormal and inconclusive findings on diagnostic imaging of breast: Secondary | ICD-10-CM

## 2022-06-20 ENCOUNTER — Ambulatory Visit
Admission: RE | Admit: 2022-06-20 | Discharge: 2022-06-20 | Disposition: A | Discharge status: Home / Self Care | Payer: PRIVATE HEALTH INSURANCE | Source: Ambulatory Visit | Attending: Obstetrics and Gynecology | Admitting: Obstetrics and Gynecology

## 2022-06-20 ENCOUNTER — Ambulatory Visit: Payer: PRIVATE HEALTH INSURANCE

## 2022-06-20 ENCOUNTER — Ambulatory Visit
Admission: RE | Admit: 2022-06-20 | Discharge: 2022-06-20 | Disposition: A | Discharge status: Home / Self Care | Payer: BC Managed Care – PPO | Source: Ambulatory Visit | Attending: Obstetrics and Gynecology | Admitting: Obstetrics and Gynecology

## 2022-06-20 ENCOUNTER — Other Ambulatory Visit: Payer: Self-pay | Admitting: Obstetrics and Gynecology

## 2022-06-20 DIAGNOSIS — R928 Other abnormal and inconclusive findings on diagnostic imaging of breast: Secondary | ICD-10-CM

## 2022-06-20 DIAGNOSIS — N6310 Unspecified lump in the right breast, unspecified quadrant: Secondary | ICD-10-CM

## 2022-06-21 ENCOUNTER — Ambulatory Visit
Admission: RE | Admit: 2022-06-21 | Discharge: 2022-06-21 | Disposition: A | Discharge status: Home / Self Care | Payer: BC Managed Care – PPO | Source: Ambulatory Visit | Attending: Obstetrics and Gynecology | Admitting: Obstetrics and Gynecology

## 2022-06-21 ENCOUNTER — Other Ambulatory Visit: Payer: Self-pay | Admitting: Obstetrics and Gynecology

## 2022-06-21 DIAGNOSIS — N6310 Unspecified lump in the right breast, unspecified quadrant: Secondary | ICD-10-CM

## 2022-12-13 ENCOUNTER — Encounter: Payer: Self-pay | Admitting: Obstetrics and Gynecology
